# Patient Record
Sex: Female | Born: 1985
Health system: Southern US, Community
[De-identification: ages and names within clinical notes are randomized; demographics above are authoritative.]

## PROBLEM LIST (undated history)

## (undated) DIAGNOSIS — E119 Type 2 diabetes mellitus without complications: Secondary | ICD-10-CM

## (undated) DIAGNOSIS — D649 Anemia, unspecified: Secondary | ICD-10-CM

## (undated) DIAGNOSIS — K429 Umbilical hernia without obstruction or gangrene: Secondary | ICD-10-CM

## (undated) DIAGNOSIS — F419 Anxiety disorder, unspecified: Secondary | ICD-10-CM

## (undated) DIAGNOSIS — I10 Essential (primary) hypertension: Secondary | ICD-10-CM

## (undated) HISTORY — PX: CHOLECYSTECTOMY: SHX55

---

## 2021-02-27 ENCOUNTER — Emergency Department (HOSPITAL_BASED_OUTPATIENT_CLINIC_OR_DEPARTMENT_OTHER)
Admission: EM | Admit: 2021-02-27 | Discharge: 2021-02-27 | Disposition: A | Discharge status: Home / Self Care | Payer: BC Managed Care – PPO | Attending: Emergency Medicine | Admitting: Emergency Medicine

## 2021-02-27 ENCOUNTER — Other Ambulatory Visit (HOSPITAL_COMMUNITY): Payer: Self-pay | Admitting: Emergency Medicine

## 2021-02-27 ENCOUNTER — Other Ambulatory Visit: Payer: Self-pay

## 2021-02-27 ENCOUNTER — Emergency Department (HOSPITAL_BASED_OUTPATIENT_CLINIC_OR_DEPARTMENT_OTHER): Payer: BC Managed Care – PPO

## 2021-02-27 ENCOUNTER — Encounter (HOSPITAL_BASED_OUTPATIENT_CLINIC_OR_DEPARTMENT_OTHER): Payer: Self-pay | Admitting: Emergency Medicine

## 2021-02-27 DIAGNOSIS — F17200 Nicotine dependence, unspecified, uncomplicated: Secondary | ICD-10-CM | POA: Insufficient documentation

## 2021-02-27 DIAGNOSIS — E119 Type 2 diabetes mellitus without complications: Secondary | ICD-10-CM | POA: Diagnosis not present

## 2021-02-27 DIAGNOSIS — S4991XA Unspecified injury of right shoulder and upper arm, initial encounter: Secondary | ICD-10-CM | POA: Diagnosis present

## 2021-02-27 DIAGNOSIS — S40021A Contusion of right upper arm, initial encounter: Secondary | ICD-10-CM | POA: Insufficient documentation

## 2021-02-27 DIAGNOSIS — S40029A Contusion of unspecified upper arm, initial encounter: Secondary | ICD-10-CM

## 2021-02-27 DIAGNOSIS — X501XXA Overexertion from prolonged static or awkward postures, initial encounter: Secondary | ICD-10-CM | POA: Insufficient documentation

## 2021-02-27 HISTORY — DX: Type 2 diabetes mellitus without complications: E11.9

## 2021-02-27 HISTORY — DX: Umbilical hernia without obstruction or gangrene: K42.9

## 2021-02-27 MED ORDER — CYCLOBENZAPRINE HCL 10 MG PO TABS: 10.0000 mg | ORAL_TABLET | Freq: Two times a day (BID) | ORAL | 0 refills | Status: AC | PRN

## 2021-02-27 MED ORDER — HYDROCODONE-ACETAMINOPHEN 5-325 MG PO TABS
1.0000 | ORAL_TABLET | Freq: Once | ORAL | Status: AC
Start: 2021-02-27 — End: 2021-02-27
  Administered 2021-02-27: 1 via ORAL
  Filled 2021-02-27: qty 1

## 2021-02-27 MED FILL — CYCLOBENZAPRINE HCL 10 MG T: 10 | 10 days supply | Qty: 20 | Fill #0

## 2021-02-27 NOTE — Discharge Instructions (Signed)
The x-ray today look normal.  Your arm is most likely bruised badly in the nerve possibly irritated.  You can use Voltaren gel to help with the pain as well as extra strength Tylenol.  Also you can use the muscle relaxer as needed if you are feeling very tight.  I would recommend doing this at night before you go to bed as it can cause drowsiness.
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
## 2021-02-27 NOTE — ED Notes (Signed)
ED Provider at bedside. 
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
## 2021-06-13 IMAGING — CR DG SHOULDER 2+V*R*
3 series · 3 of 3 positions shown · non-contrast
Comparison: None.

CLINICAL DATA: Pain.  Status post trauma

EXAM:
RIGHT SHOULDER - 2+ VIEW

[w shoulder grashey right *]
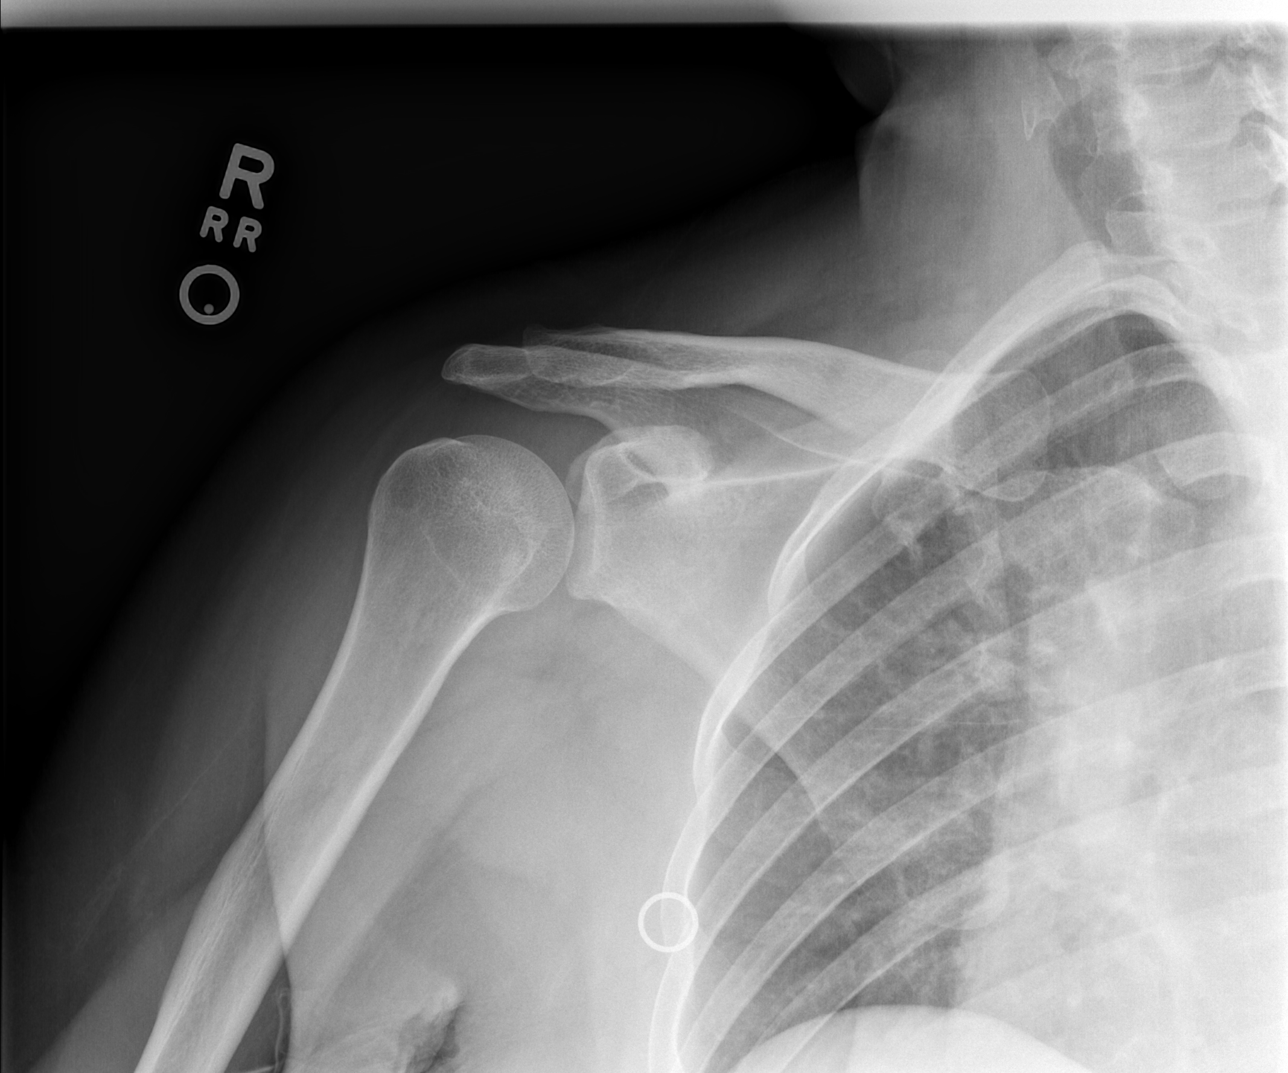

[w shoulder y view right *]
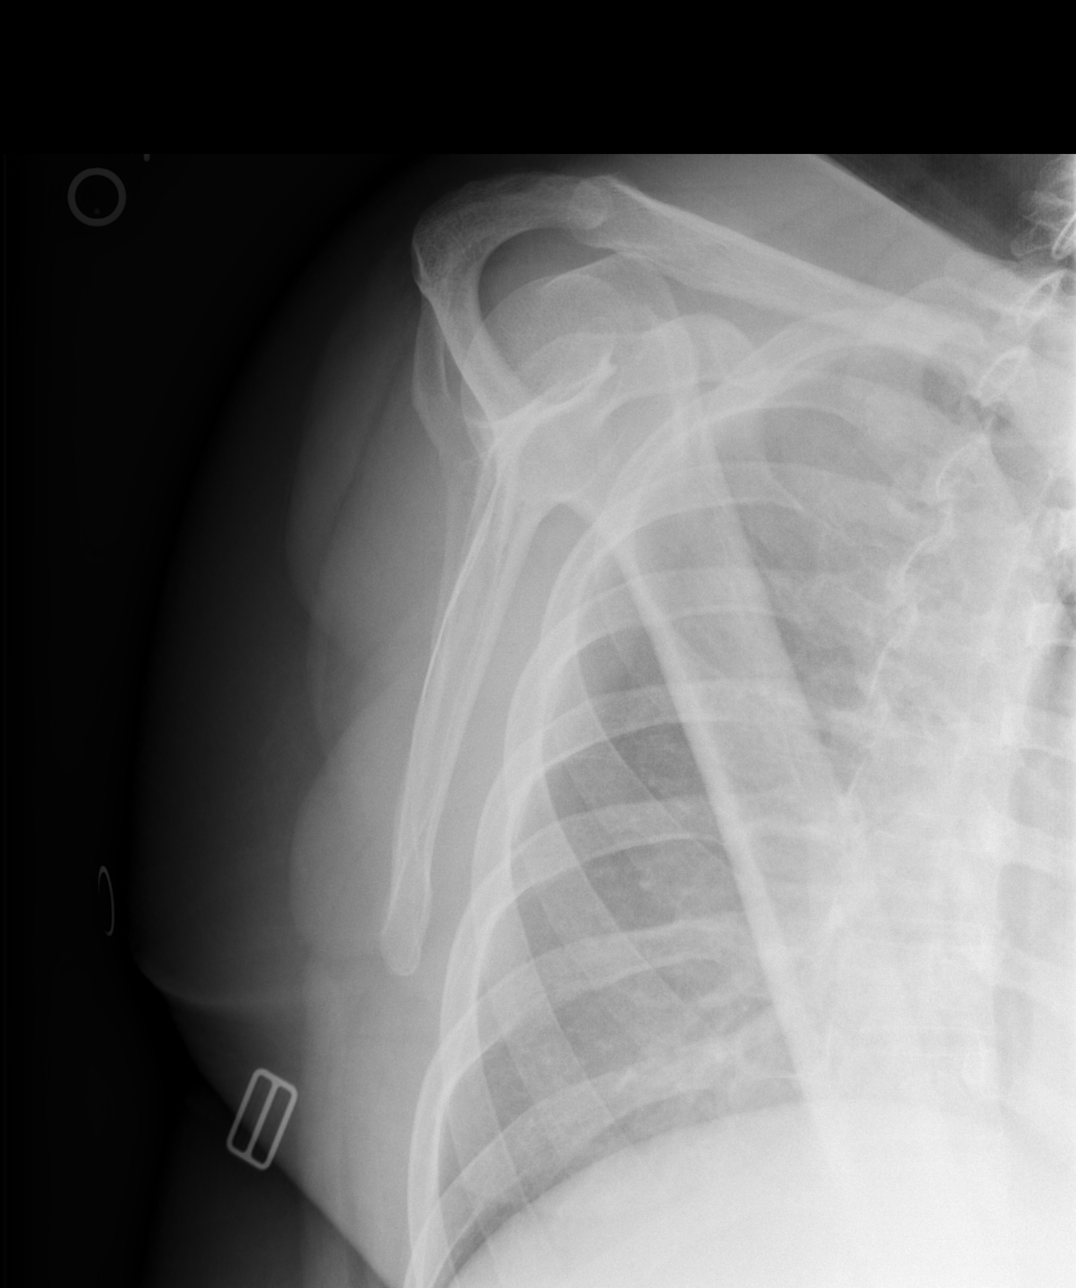

[x shoulder axillary right *]
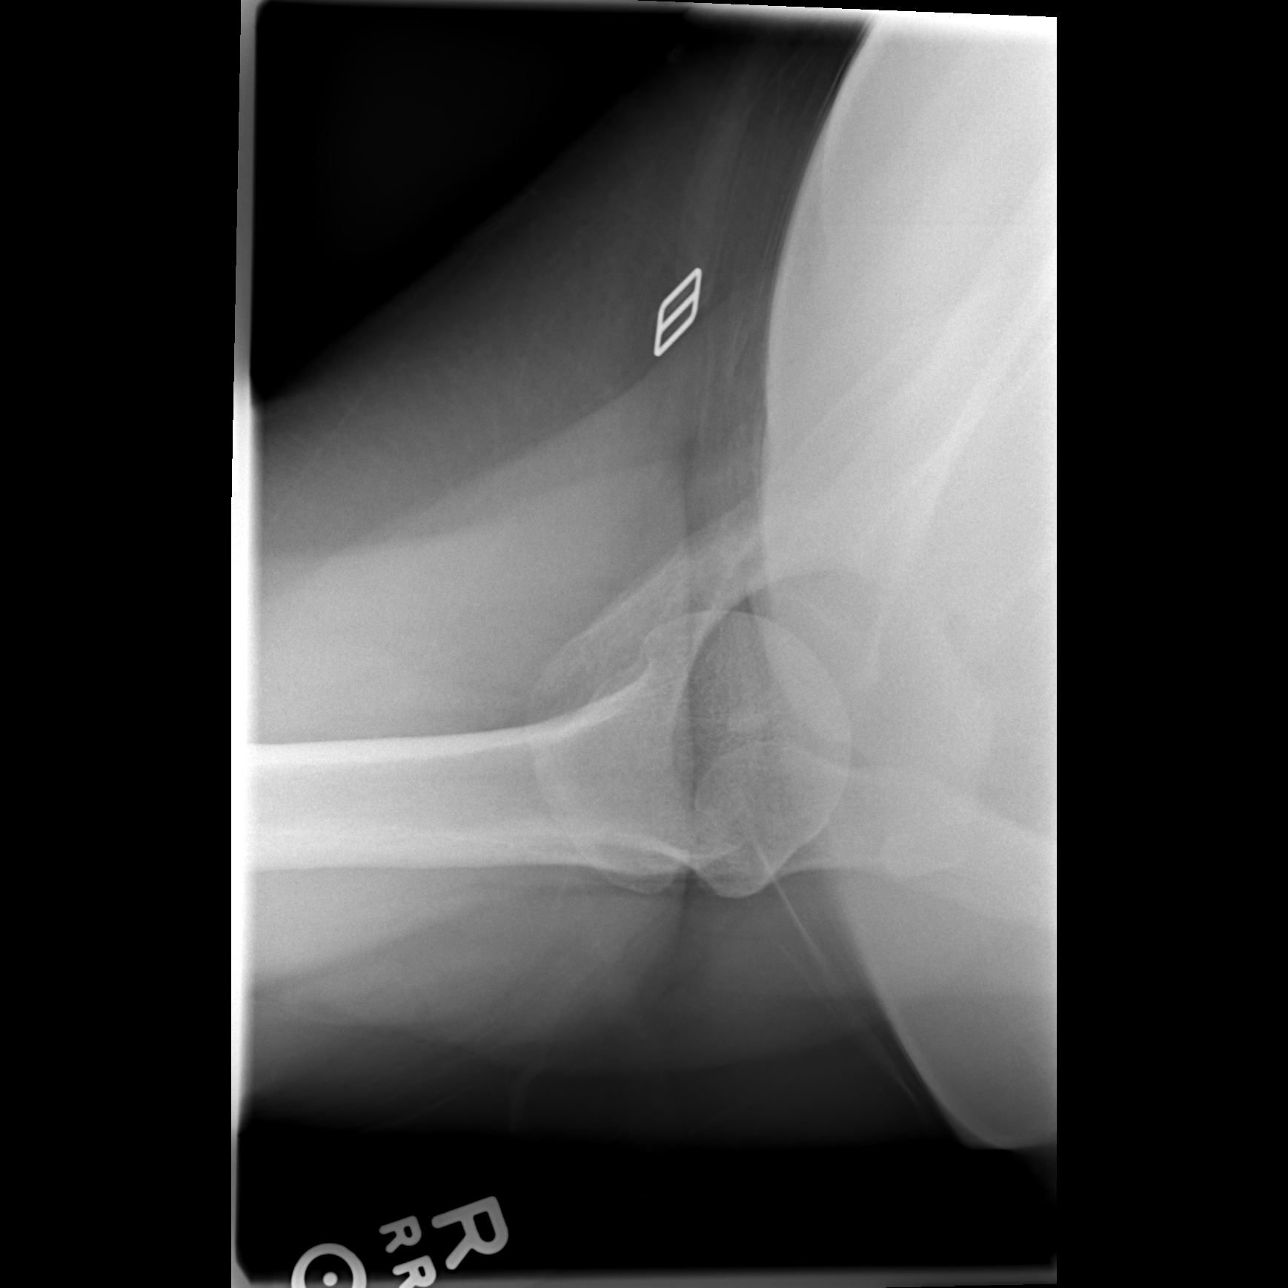

[3 of 3 positions shown; findings below may reference images not displayed]

FINDINGS: There is no evidence of fracture or dislocation. There is no
evidence of arthropathy or other focal bone abnormality. Soft
tissues are unremarkable.
IMPRESSION: Negative.

## 2021-07-11 ENCOUNTER — Emergency Department (HOSPITAL_BASED_OUTPATIENT_CLINIC_OR_DEPARTMENT_OTHER)
Admission: EM | Admit: 2021-07-11 | Discharge: 2021-07-11 | Disposition: A | Discharge status: Home / Self Care | Payer: BC Managed Care – PPO | Attending: Emergency Medicine | Admitting: Emergency Medicine

## 2021-07-11 ENCOUNTER — Encounter (HOSPITAL_BASED_OUTPATIENT_CLINIC_OR_DEPARTMENT_OTHER): Payer: Self-pay | Admitting: *Deleted

## 2021-07-11 ENCOUNTER — Other Ambulatory Visit: Payer: Self-pay

## 2021-07-11 DIAGNOSIS — Z23 Encounter for immunization: Secondary | ICD-10-CM | POA: Insufficient documentation

## 2021-07-11 DIAGNOSIS — Z91040 Latex allergy status: Secondary | ICD-10-CM | POA: Insufficient documentation

## 2021-07-11 DIAGNOSIS — N6310 Unspecified lump in the right breast, unspecified quadrant: Secondary | ICD-10-CM | POA: Diagnosis not present

## 2021-07-11 DIAGNOSIS — E119 Type 2 diabetes mellitus without complications: Secondary | ICD-10-CM | POA: Insufficient documentation

## 2021-07-11 DIAGNOSIS — T23022A Burn of unspecified degree of single left finger (nail) except thumb, initial encounter: Secondary | ICD-10-CM | POA: Diagnosis present

## 2021-07-11 DIAGNOSIS — N63 Unspecified lump in unspecified breast: Secondary | ICD-10-CM

## 2021-07-11 DIAGNOSIS — T23229A Burn of second degree of unspecified single finger (nail) except thumb, initial encounter: Secondary | ICD-10-CM

## 2021-07-11 DIAGNOSIS — Z5321 Procedure and treatment not carried out due to patient leaving prior to being seen by health care provider: Secondary | ICD-10-CM | POA: Insufficient documentation

## 2021-07-11 DIAGNOSIS — F17200 Nicotine dependence, unspecified, uncomplicated: Secondary | ICD-10-CM | POA: Diagnosis not present

## 2021-07-11 DIAGNOSIS — X19XXXA Contact with other heat and hot substances, initial encounter: Secondary | ICD-10-CM | POA: Diagnosis not present

## 2021-07-11 DIAGNOSIS — X101XXA Contact with hot food, initial encounter: Secondary | ICD-10-CM | POA: Insufficient documentation

## 2021-07-11 DIAGNOSIS — T23222A Burn of second degree of single left finger (nail) except thumb, initial encounter: Secondary | ICD-10-CM | POA: Diagnosis not present

## 2021-07-11 MED ORDER — TETANUS-DIPHTH-ACELL PERTUSSIS 5-2.5-18.5 LF-MCG/0.5 IM SUSY
0.5000 mL | PREFILLED_SYRINGE | Freq: Once | INTRAMUSCULAR | Status: AC
  Administered 2021-07-11: 0.5 mL via INTRAMUSCULAR
  Filled 2021-07-11: qty 0.5

## 2021-07-11 MED ORDER — BACITRACIN ZINC 500 UNIT/GM EX OINT
TOPICAL_OINTMENT | Freq: Two times a day (BID) | CUTANEOUS | Status: DC
  Filled 2021-07-11: qty 28.35

## 2021-07-11 NOTE — Discharge Instructions (Addendum)
Apply bacitracin to burn.  Take Motrin and Tylenol as needed as directed.   GYN clinic info: Walk-ins for certain complaints available at:   [facility] [address]  [phone]  See hours at [email or web address]   [facility] [employer] [facility]  [address]  [phone]   [facility] [employer] [facility]  [phone]   You can make an appointment to see a GYN provider:   [facility] at [facility]  [address]  [phone]   [facility] at [facility]  [address] [employer]  [phone]   [facility]  [phone]   [facility] at [facility]  [address]  [phone]   If you already have an established OB/GYN provider in the area you can make an appointment with them as well.
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
## 2021-07-11 NOTE — ED Provider Notes (Signed)
[facility] EMERGENCY DEPARTMENT Provider Note   CSN: [id number] Arrival date & time: 07/11/21  1925     History Chief Complaint  Patient presents with   Burn    Lori Saunders is a 35 y.o. female.  35 year old female presents with complaint of burn to her left third finger.  The patient states that she took a pop tart of the oven and the filling dripped onto her finger resulting in a burn.  Has a blister to the area.  Last tetanus unknown.  Reports also concern for right breast mass, was in the shower today and found a lump in her right upper outer breast area, mother with history of breast cancer.  Unable to find primary care provider since relocating to this area.  No other complaints or concerns.      Past Medical History:  Diagnosis Date   Diabetes mellitus without complication (HCC)    Umbilical hernia     There are no problems to display for this patient.   Past Surgical History:  Procedure Laterality Date   CHOLECYSTECTOMY       OB History   No obstetric history on file.     No family history on file.  Social History   Tobacco Use   Smoking status: Every Day   Smokeless tobacco: Never  Vaping Use   Vaping Use: Some days  Substance Use Topics   Alcohol use: Never   Drug use: Never    Home Medications Prior to Admission medications   Medication Sig Start Date End Date Taking? Authorizing Provider  Canagliflozin (INVOKANA PO) Take by mouth.    [provider]  cyclobenzaprine (FLEXERIL) 10 MG tablet Take 1 tablet (10 mg total) by mouth 2 (two) times daily as needed for muscle spasms. 02/27/21   Gwyneth Sprout, MD  cyclobenzaprine (FLEXERIL) 10 MG tablet TAKE 1 TABLET (10 MG TOTAL) BY MOUTH 2 (TWO) TIMES DAILY AS NEEDED FOR MUSCLE SPASMS. 02/27/21 02/27/22  Gwyneth Sprout, MD    Allergies    Latex  Review of Systems   Review of Systems  Constitutional:  Negative for fever.  Respiratory:  Negative for shortness of breath.    Cardiovascular:  Negative for chest pain.  Gastrointestinal:  Negative for abdominal pain.  Musculoskeletal:  Negative for arthralgias, joint swelling and myalgias.  Skin:  Positive for wound.  Allergic/Immunologic: Positive for immunocompromised state.  Neurological:  Negative for weakness and numbness.   Physical Exam Updated Vital Signs BP 122/85 (BP Location: Right Arm)   Pulse 90   Temp 98.8 F (37.1 C) (Oral)   Resp 19   Ht 5\' 3"  (1.6 m)   Wt 101.2 kg   LMP 07/09/2021   SpO2 98%   BMI 39.52 kg/m   Physical Exam Vitals and nursing note reviewed.  Constitutional:      General: She is not in acute distress.    Appearance: She is well-developed. She is not diaphoretic.  HENT:     Head: Normocephalic and atraumatic.  Cardiovascular:     Pulses: Normal pulses.  Pulmonary:     Effort: Pulmonary effort is normal.  Chest:     Chest wall: No mass, lacerations, deformity, swelling or tenderness.  Musculoskeletal:        General: Tenderness and signs of injury present. No deformity. Normal range of motion.  Skin:    General: Skin is warm and dry.     Findings: No erythema or rash.  Neurological:  Mental Status: She is alert and oriented to person, place, and time.  Psychiatric:        Behavior: Behavior normal.    ED Results / Procedures / Treatments   Labs (all labs ordered are listed, but only abnormal results are displayed) Labs Reviewed - No data to display  EKG None  Radiology No results found.  Procedures Procedures   Medications Ordered in ED Medications  bacitracin ointment (has no administration in time range)  Tdap (BOOSTRIX) injection 0.5 mL (has no administration in time range)    ED Course  I have reviewed the triage vital signs and the nursing notes.  Pertinent labs & imaging results that were available during my care of the patient were reviewed by me and considered in my medical decision making (see chart for details).  Clinical  Course as of 07/11/21 2032  Tue Jul 11, 2021  [id number] 35 year old female with burn to left third finger.  We will update tetanus and apply bacitracin to the wound.  In regards to her right breast mass, nothing appreciated on exam today, will give GYN clinic follow-up information. [LM]    Clinical Course User Index [LM] Alden Hipp   MDM Rules/Calculators/A&P                           Final Clinical Impression(s) / ED Diagnoses Final diagnoses:  Deep partial thickness burn of finger  Breast lump in female    Rx / DC Orders ED Discharge Orders     None        Alden Hipp 07/11/21 2032    Jacalyn Lefevre, MD 07/11/21 2122
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
## 2021-07-13 ENCOUNTER — Encounter (HOSPITAL_BASED_OUTPATIENT_CLINIC_OR_DEPARTMENT_OTHER): Payer: Self-pay | Admitting: *Deleted

## 2021-07-13 ENCOUNTER — Emergency Department (HOSPITAL_BASED_OUTPATIENT_CLINIC_OR_DEPARTMENT_OTHER)
Admission: EM | Admit: 2021-07-13 | Discharge: 2021-07-13 | Disposition: A | Discharge status: Home / Self Care | Payer: BC Managed Care – PPO | Attending: Emergency Medicine | Admitting: Emergency Medicine

## 2021-07-13 ENCOUNTER — Other Ambulatory Visit: Payer: Self-pay

## 2021-07-13 DIAGNOSIS — M533 Sacrococcygeal disorders, not elsewhere classified: Secondary | ICD-10-CM | POA: Diagnosis present

## 2021-07-13 DIAGNOSIS — Z5321 Procedure and treatment not carried out due to patient leaving prior to being seen by health care provider: Secondary | ICD-10-CM | POA: Diagnosis not present

## 2021-07-13 NOTE — ED Triage Notes (Signed)
C/o tail bone pain x 3 days , denies injury , also c/o vaginal discharge and irritation

## 2021-08-23 ENCOUNTER — Encounter: Payer: BC Managed Care – PPO | Admitting: Family Medicine

## 2021-10-08 ENCOUNTER — Other Ambulatory Visit: Payer: Self-pay

## 2021-10-08 ENCOUNTER — Emergency Department (HOSPITAL_BASED_OUTPATIENT_CLINIC_OR_DEPARTMENT_OTHER)
Admission: EM | Admit: 2021-10-08 | Discharge: 2021-10-08 | Disposition: A | Discharge status: Home / Self Care | Payer: BC Managed Care – PPO | Attending: Emergency Medicine | Admitting: Emergency Medicine

## 2021-10-08 ENCOUNTER — Encounter (HOSPITAL_BASED_OUTPATIENT_CLINIC_OR_DEPARTMENT_OTHER): Payer: Self-pay | Admitting: Urology

## 2021-10-08 DIAGNOSIS — E162 Hypoglycemia, unspecified: Secondary | ICD-10-CM | POA: Diagnosis present

## 2021-10-08 DIAGNOSIS — Z5321 Procedure and treatment not carried out due to patient leaving prior to being seen by health care provider: Secondary | ICD-10-CM | POA: Insufficient documentation

## 2021-10-08 LAB — CBG MONITORING, ED: Glucose-Capillary: 169 mg/dL — ABNORMAL HIGH (ref 70–99)

## 2021-10-08 NOTE — ED Triage Notes (Signed)
Has new internal Blood glucose meter, states BS 42, took OJ at 1900, grapes and cheese in past 20 min.  Farxigo started 3 days ago as well.

## 2021-10-29 ENCOUNTER — Emergency Department (HOSPITAL_BASED_OUTPATIENT_CLINIC_OR_DEPARTMENT_OTHER)
Admission: EM | Admit: 2021-10-29 | Discharge: 2021-10-30 | Disposition: A | Discharge status: Home / Self Care | Payer: BC Managed Care – PPO | Attending: Emergency Medicine | Admitting: Emergency Medicine

## 2021-10-29 ENCOUNTER — Other Ambulatory Visit: Payer: Self-pay

## 2021-10-29 ENCOUNTER — Encounter (HOSPITAL_BASED_OUTPATIENT_CLINIC_OR_DEPARTMENT_OTHER): Payer: Self-pay

## 2021-10-29 DIAGNOSIS — R1033 Periumbilical pain: Secondary | ICD-10-CM

## 2021-10-29 DIAGNOSIS — N9489 Other specified conditions associated with female genital organs and menstrual cycle: Secondary | ICD-10-CM | POA: Insufficient documentation

## 2021-10-29 DIAGNOSIS — E119 Type 2 diabetes mellitus without complications: Secondary | ICD-10-CM | POA: Diagnosis not present

## 2021-10-29 DIAGNOSIS — K429 Umbilical hernia without obstruction or gangrene: Secondary | ICD-10-CM | POA: Insufficient documentation

## 2021-10-29 DIAGNOSIS — R197 Diarrhea, unspecified: Secondary | ICD-10-CM | POA: Diagnosis not present

## 2021-10-29 DIAGNOSIS — I10 Essential (primary) hypertension: Secondary | ICD-10-CM | POA: Insufficient documentation

## 2021-10-29 DIAGNOSIS — Z7984 Long term (current) use of oral hypoglycemic drugs: Secondary | ICD-10-CM | POA: Diagnosis not present

## 2021-10-29 DIAGNOSIS — F17210 Nicotine dependence, cigarettes, uncomplicated: Secondary | ICD-10-CM | POA: Diagnosis not present

## 2021-10-29 DIAGNOSIS — R112 Nausea with vomiting, unspecified: Secondary | ICD-10-CM | POA: Diagnosis not present

## 2021-10-29 HISTORY — DX: Essential (primary) hypertension: I10

## 2021-10-29 NOTE — ED Triage Notes (Signed)
Coffee ground emesis and dark stools x3 days.

## 2021-10-30 ENCOUNTER — Other Ambulatory Visit (HOSPITAL_BASED_OUTPATIENT_CLINIC_OR_DEPARTMENT_OTHER): Payer: Self-pay

## 2021-10-30 ENCOUNTER — Emergency Department (HOSPITAL_BASED_OUTPATIENT_CLINIC_OR_DEPARTMENT_OTHER): Payer: BC Managed Care – PPO

## 2021-10-30 LAB — CBC
HCT: 45.4 % (ref 36.0–46.0)
Hemoglobin: 15 g/dL (ref 12.0–15.0)
MCH: 27.1 pg (ref 26.0–34.0)
MCHC: 33 g/dL (ref 30.0–36.0)
MCV: 82.1 fL (ref 80.0–100.0)
Platelets: 430 10*3/uL — ABNORMAL HIGH (ref 150–400)
RBC: 5.53 MIL/uL — ABNORMAL HIGH (ref 3.87–5.11)
RDW: 13.5 % (ref 11.5–15.5)
WBC: 16.8 10*3/uL — ABNORMAL HIGH (ref 4.0–10.5)
nRBC: 0 % (ref 0.0–0.2)

## 2021-10-30 LAB — COMPREHENSIVE METABOLIC PANEL
ALT: 23 U/L (ref 0–44)
AST: 17 U/L (ref 15–41)
Albumin: 4.2 g/dL (ref 3.5–5.0)
Alkaline Phosphatase: 90 U/L (ref 38–126)
Anion gap: 10 (ref 5–15)
BUN: 19 mg/dL (ref 6–20)
CO2: 25 mmol/L (ref 22–32)
Calcium: 9.6 mg/dL (ref 8.9–10.3)
Chloride: 101 mmol/L (ref 98–111)
Creatinine, Ser: 0.66 mg/dL (ref 0.44–1.00)
GFR, Estimated: >60 mL/min (ref >60)
Glucose, Bld: 114 mg/dL — ABNORMAL HIGH (ref 70–99)
Potassium: 3.6 mmol/L (ref 3.5–5.1)
Sodium: 136 mmol/L (ref 135–145)
Total Bilirubin: 0.4 mg/dL (ref 0.3–1.2)
Total Protein: 7.6 g/dL (ref 6.5–8.1)

## 2021-10-30 LAB — LIPASE, BLOOD: Lipase: 35 U/L (ref 11–51)

## 2021-10-30 LAB — HCG, QUANTITATIVE, PREGNANCY: hCG, Beta Chain, Quant, S: <1 m[IU]/mL (ref <5)

## 2021-10-30 MED ORDER — ONDANSETRON 4 MG PO TBDP
4.0000 mg | ORAL_TABLET | Freq: Three times a day (TID) | ORAL | 0 refills | Status: AC | PRN
  Filled 2021-10-30 – 2021-11-10 (×2): qty 15, 5d supply, fill #0

## 2021-10-30 MED ORDER — KETOROLAC TROMETHAMINE 15 MG/ML IJ SOLN
15.0000 mg | Freq: Once | INTRAMUSCULAR | Status: AC
  Administered 2021-10-30: 02:00:00 15 mg via INTRAVENOUS
  Filled 2021-10-30: qty 1

## 2021-10-30 MED ORDER — SODIUM CHLORIDE 0.9 % IV BOLUS
1000.0000 mL | Freq: Once | INTRAVENOUS | Status: AC
  Administered 2021-10-30: 01:00:00 1000 mL via INTRAVENOUS

## 2021-10-30 MED ORDER — IOHEXOL 300 MG/ML  SOLN
100.0000 mL | Freq: Once | INTRAMUSCULAR | Status: AC | PRN
  Administered 2021-10-30: 02:00:00 100 mL via INTRAVENOUS

## 2021-10-30 MED ORDER — MORPHINE SULFATE (PF) 4 MG/ML IV SOLN: 4.0000 mg | Freq: Once | INTRAVENOUS | Status: DC

## 2021-10-30 MED ORDER — ONDANSETRON HCL 4 MG/2ML IJ SOLN
4.0000 mg | Freq: Once | INTRAMUSCULAR | Status: AC
  Administered 2021-10-30: 01:00:00 4 mg via INTRAVENOUS
  Filled 2021-10-30: qty 2

## 2021-10-30 NOTE — ED Notes (Signed)
Patient transported to CT 

## 2021-10-30 NOTE — ED Provider Notes (Signed)
[facility] EMERGENCY DEPARTMENT Provider Note  CSN: [id number] Arrival date & time: 10/29/21 2327  Chief Complaint(s) Abdominal Pain and Chest Pain  HPI Lori Saunders is a 35 y.o. female   The history is provided by the patient.  Abdominal Pain Pain location:  Periumbilical Pain quality: aching and cramping   Pain radiates to:  Epigastric region Pain severity:  Moderate Onset quality:  Gradual Duration:  2 days Timing:  Intermittent Progression:  Waxing and waning Chronicity:  New Relieved by:  Nothing Worsened by:  Vomiting Associated symptoms: chest pain (with emesis), diarrhea, nausea and vomiting   Associated symptoms: no fever   Chest Pain Pain quality: aching   Pain radiates to:  Does not radiate Context comment:  After an episode of emesis Relieved by: self resolved. Associated symptoms: abdominal pain, nausea and vomiting   Associated symptoms: no fever    Past Medical History Past Medical History:  Diagnosis Date   Diabetes mellitus without complication ([place])    Hypertension    Umbilical hernia    There are no problems to display for this patient.  Home Medication(s) Prior to Admission medications   Medication Sig Start Date End Date Taking? Authorizing Provider  ondansetron (ZOFRAN-ODT) 4 MG disintegrating tablet Take 1 tablet (4 mg total) by mouth every 8 (eight) hours as needed for up to 3 days for nausea or vomiting. 10/30/21 11/02/21 Yes Ladell Lea, Grayce Sessions, MD  Canagliflozin (INVOKANA PO) Take by mouth.    [provider]  cyclobenzaprine (FLEXERIL) 10 MG tablet Take 1 tablet (10 mg total) by mouth 2 (two) times daily as needed for muscle spasms. 02/27/21   Blanchie Dessert, MD  cyclobenzaprine (FLEXERIL) 10 MG tablet TAKE 1 TABLET (10 MG TOTAL) BY MOUTH 2 (TWO) TIMES DAILY AS NEEDED FOR MUSCLE SPASMS. 02/27/21 02/27/22  Blanchie Dessert, MD                                                                                                                                     Past Surgical History Past Surgical History:  Procedure Laterality Date   CHOLECYSTECTOMY     Family History No family history on file.  Social History Social History   Tobacco Use   Smoking status: Every Day    Packs/day: 0.50    Types: Cigarettes   Smokeless tobacco: Never  Vaping Use   Vaping Use: Some days  Substance Use Topics   Alcohol use: Never   Drug use: Never   Allergies Latex  Review of Systems Review of Systems  Constitutional:  Negative for fever.  Cardiovascular:  Positive for chest pain (with emesis).  Gastrointestinal:  Positive for abdominal pain, diarrhea, nausea and vomiting.  All other systems are reviewed and are negative for acute change except as noted in the HPI  Physical Exam Vital Signs  I have reviewed the triage vital signs BP 118/81 (BP Location: Right Arm)  Pulse 98   Temp 98.3 F (36.8 C) (Oral)   Resp 18   Ht 5\' 3"  (1.6 m)   Wt 97.1 kg   LMP 10/22/2021 (Exact Date)   SpO2 98%   BMI 37.91 kg/m   Physical Exam Vitals reviewed.  Constitutional:      General: She is not in acute distress.    Appearance: She is well-developed. She is not diaphoretic.  HENT:     Head: Normocephalic and atraumatic.     Right Ear: External ear normal.     Left Ear: External ear normal.     Nose: Nose normal.  Eyes:     General: No scleral icterus.    Conjunctiva/sclera: Conjunctivae normal.  Neck:     Trachea: Phonation normal.  Cardiovascular:     Rate and Rhythm: Normal rate and regular rhythm.  Pulmonary:     Effort: Pulmonary effort is normal. No respiratory distress.     Breath sounds: No stridor.  Abdominal:     General: There is no distension.     Tenderness: There is abdominal tenderness in the periumbilical area.     Hernia: A hernia is present. Hernia is present in the umbilical area.  Musculoskeletal:        General: Normal range of motion.     Cervical back: Normal range of  motion.  Neurological:     Mental Status: She is alert and oriented to person, place, and time.  Psychiatric:        Behavior: Behavior normal.    ED Results and Treatments Labs (all labs ordered are listed, but only abnormal results are displayed) Labs Reviewed  COMPREHENSIVE METABOLIC PANEL - Abnormal; Notable for the following components:      Result Value   Glucose, Bld 114 (*)    All other components within normal limits  CBC - Abnormal; Notable for the following components:   WBC 16.8 (*)    RBC 5.53 (*)    Platelets 430 (*)    All other components within normal limits  LIPASE, BLOOD  HCG, QUANTITATIVE, PREGNANCY  URINALYSIS, ROUTINE W REFLEX MICROSCOPIC  PREGNANCY, URINE                                                                                                                         EKG  EKG Interpretation  Date/Time:  Sunday October 29 2021 23:48:05 EST Ventricular Rate:  101 PR Interval:  128 QRS Duration: 90 QT Interval:  322 QTC Calculation: 417 R Axis:   91 Text Interpretation: Sinus tachycardia Rightward axis T wave abnormality, consider inferior ischemia T wave abnormality, consider anterolateral ischemia Abnormal ECG No old tracing to compare Confirmed by Addison Lank [phone]) on 10/30/2021 1:35:44 AM       Radiology CT ABDOMEN PELVIS W CONTRAST  Result Date: 10/30/2021 CLINICAL DATA:  Cough the ground emesis and dark stools. EXAM: CT ABDOMEN AND PELVIS WITH CONTRAST TECHNIQUE: Multidetector CT imaging of the abdomen and pelvis  was performed using the standard protocol following bolus administration of intravenous contrast. CONTRAST:  OMNIPAQUE IOHEXOL 300 MG/ML  SOLN COMPARISON:  None. FINDINGS: Lower chest: No acute abnormality. Hepatobiliary: No focal liver abnormality is seen. Status post cholecystectomy. No biliary dilatation. Pancreas: Unremarkable. No pancreatic ductal dilatation or surrounding inflammatory changes. Spleen: Normal in  size without focal abnormality. Adrenals/Urinary Tract: Adrenal glands are unremarkable. Kidneys are normal in size, without renal calculi or hydronephrosis. A subcentimeter cyst is seen along the posterolateral aspect of the mid left kidney. Bladder is unremarkable. Stomach/Bowel: Stomach is within normal limits. Appendix appears normal. No evidence of bowel wall thickening, distention, or inflammatory changes. Vascular/Lymphatic: No significant vascular findings are present. No enlarged abdominal or pelvic lymph nodes. Reproductive: Uterus and bilateral adnexa are unremarkable. Other: A 3.2 cm x 3.4 cm fat-containing umbilical hernia is seen. No abdominopelvic ascites. Musculoskeletal: No acute or significant osseous findings. IMPRESSION: 1. Evidence of prior cholecystectomy. 2. Fat-containing umbilical hernia. Electronically Signed   By: Aram Candela M.D.   On: 10/30/2021 02:25    Pertinent labs & imaging results that were available during my care of the patient were reviewed by me and considered in my medical decision making (see MDM for details).  Medications Ordered in ED Medications  sodium chloride 0.9 % bolus 1,000 mL (1,000 mLs Intravenous New Bag/Given 10/30/21 0127)  ondansetron (ZOFRAN) injection 4 mg (4 mg Intravenous Given 10/30/21 0128)  ketorolac (TORADOL) 15 MG/ML injection 15 mg (15 mg Intravenous Given 10/30/21 0130)  iohexol (OMNIPAQUE) 300 MG/ML solution 100 mL (100 mLs Intravenous Contrast Given 10/30/21 0221)                                                                                                                                     Procedures Procedures  (including critical care time)  Medical Decision Making / ED Course I have reviewed the nursing notes for this encounter and the patient's prior records (if available in EHR or on provided paperwork).  Lori Saunders was evaluated in Emergency Department on 10/30/2021 for the symptoms described in the history  of present illness. She was evaluated in the context of the global COVID-19 pandemic, which necessitated consideration that the patient might be at risk for infection with the SARS-CoV-2 virus that causes COVID-19. Institutional protocols and algorithms that pertain to the evaluation of patients at risk for COVID-19 are in a state of rapid change based on information released by regulatory bodies including the CDC and federal and state organizations. These policies and algorithms were followed during the patient's care in the ED.     35 y.o. female presents with vomiting, diarrhea for several days with periumbilical pain. No known possible suspicious food intake. Marland Kitchen decreased oral tolerance. Rest of history as above.  Patient appears well, not in distress, and with no signs of toxicity or dehydration. Abdomen with umbilical hernia, TTP.  Rest of the exam  as above  Labs notable for leukocytosis.  No significant electrolyte derangements or renal insufficiency.  No evidence of bili obstruction or pancreatitis.  CT scan negative for bowel obstruction, severe colitis or other serious intra-abdominal inflammatory/infectious process.  Most consistent with viral gastroenteritis.  Patient treated symptomatically.  Able to tolerate oral intake in the ED.  Discussed symptomatic treatment with the patient and they will follow closely with their PCP.  Pertinent labs & imaging results that were available during my care of the patient were reviewed by me and considered in my medical decision making:    Final Clinical Impression(s) / ED Diagnoses Final diagnoses:  Nausea vomiting and diarrhea  Periumbilical abdominal pain   The patient appears reasonably screened and/or stabilized for discharge and I doubt any other medical condition or other [facility] requiring further screening, evaluation, or treatment in the ED at this time prior to discharge. Safe for discharge with strict return  precautions.  Disposition: Discharge  Condition: Good  I have discussed the results, Dx and Tx plan with the patient/family who expressed understanding and agree(s) with the plan. Discharge instructions discussed at length. The patient/family was given strict return precautions who verbalized understanding of the instructions. No further questions at time of discharge.    ED Discharge Orders          Ordered    ondansetron (ZOFRAN-ODT) 4 MG disintegrating tablet  Every 8 hours PRN        10/30/21 0330             Follow Up: Primary care provider  Call  as needed     This chart was dictated using voice recognition software.  Despite best efforts to proofread,  errors can occur which can change the documentation meaning.    Fatima Blank, MD 10/30/21 0330

## 2021-11-07 ENCOUNTER — Other Ambulatory Visit (HOSPITAL_BASED_OUTPATIENT_CLINIC_OR_DEPARTMENT_OTHER): Payer: Self-pay

## 2021-11-10 ENCOUNTER — Other Ambulatory Visit (HOSPITAL_BASED_OUTPATIENT_CLINIC_OR_DEPARTMENT_OTHER): Payer: Self-pay

## 2021-11-18 ENCOUNTER — Emergency Department (HOSPITAL_BASED_OUTPATIENT_CLINIC_OR_DEPARTMENT_OTHER): Payer: BC Managed Care – PPO

## 2021-11-18 ENCOUNTER — Other Ambulatory Visit: Payer: Self-pay

## 2021-11-18 ENCOUNTER — Encounter (HOSPITAL_BASED_OUTPATIENT_CLINIC_OR_DEPARTMENT_OTHER): Payer: Self-pay | Admitting: Emergency Medicine

## 2021-11-18 ENCOUNTER — Emergency Department (HOSPITAL_BASED_OUTPATIENT_CLINIC_OR_DEPARTMENT_OTHER)
Admission: EM | Admit: 2021-11-18 | Discharge: 2021-11-18 | Disposition: A | Discharge status: Home / Self Care | Payer: BC Managed Care – PPO | Attending: Emergency Medicine | Admitting: Emergency Medicine

## 2021-11-18 DIAGNOSIS — I10 Essential (primary) hypertension: Secondary | ICD-10-CM | POA: Diagnosis not present

## 2021-11-18 DIAGNOSIS — R079 Chest pain, unspecified: Secondary | ICD-10-CM

## 2021-11-18 DIAGNOSIS — Z7984 Long term (current) use of oral hypoglycemic drugs: Secondary | ICD-10-CM | POA: Insufficient documentation

## 2021-11-18 DIAGNOSIS — F17210 Nicotine dependence, cigarettes, uncomplicated: Secondary | ICD-10-CM | POA: Diagnosis not present

## 2021-11-18 DIAGNOSIS — R531 Weakness: Secondary | ICD-10-CM | POA: Insufficient documentation

## 2021-11-18 DIAGNOSIS — E119 Type 2 diabetes mellitus without complications: Secondary | ICD-10-CM | POA: Diagnosis not present

## 2021-11-18 DIAGNOSIS — R197 Diarrhea, unspecified: Secondary | ICD-10-CM

## 2021-11-18 DIAGNOSIS — Z79899 Other long term (current) drug therapy: Secondary | ICD-10-CM | POA: Diagnosis not present

## 2021-11-18 DIAGNOSIS — R1013 Epigastric pain: Secondary | ICD-10-CM | POA: Diagnosis not present

## 2021-11-18 DIAGNOSIS — R072 Precordial pain: Secondary | ICD-10-CM | POA: Diagnosis not present

## 2021-11-18 DIAGNOSIS — Z91040 Latex allergy status: Secondary | ICD-10-CM | POA: Diagnosis not present

## 2021-11-18 DIAGNOSIS — E876 Hypokalemia: Secondary | ICD-10-CM

## 2021-11-18 LAB — C DIFFICILE QUICK SCREEN W PCR REFLEX
C Diff antigen: NEGATIVE
C Diff interpretation: NOT DETECTED
C Diff toxin: NEGATIVE

## 2021-11-18 LAB — CBC
HCT: 42.5 % (ref 36.0–46.0)
Hemoglobin: 14 g/dL (ref 12.0–15.0)
MCH: 27 pg (ref 26.0–34.0)
MCHC: 32.9 g/dL (ref 30.0–36.0)
MCV: 82 fL (ref 80.0–100.0)
Platelets: 385 10*3/uL (ref 150–400)
RBC: 5.18 MIL/uL — ABNORMAL HIGH (ref 3.87–5.11)
RDW: 14.1 % (ref 11.5–15.5)
WBC: 18 10*3/uL — ABNORMAL HIGH (ref 4.0–10.5)
nRBC: 0 % (ref 0.0–0.2)

## 2021-11-18 LAB — COMPREHENSIVE METABOLIC PANEL
ALT: 35 U/L (ref 0–44)
AST: 30 U/L (ref 15–41)
Albumin: 3.6 g/dL (ref 3.5–5.0)
Alkaline Phosphatase: 101 U/L (ref 38–126)
Anion gap: 9 (ref 5–15)
BUN: 10 mg/dL (ref 6–20)
CO2: 22 mmol/L (ref 22–32)
Calcium: 8.5 mg/dL — ABNORMAL LOW (ref 8.9–10.3)
Chloride: 103 mmol/L (ref 98–111)
Creatinine, Ser: 0.45 mg/dL (ref 0.44–1.00)
GFR, Estimated: >60 mL/min (ref >60)
Glucose, Bld: 127 mg/dL — ABNORMAL HIGH (ref 70–99)
Potassium: 3.1 mmol/L — ABNORMAL LOW (ref 3.5–5.1)
Sodium: 134 mmol/L — ABNORMAL LOW (ref 135–145)
Total Bilirubin: 0.8 mg/dL (ref 0.3–1.2)
Total Protein: 7 g/dL (ref 6.5–8.1)

## 2021-11-18 LAB — TROPONIN I (HIGH SENSITIVITY): Troponin I (High Sensitivity): <2 ng/L (ref <18)

## 2021-11-18 LAB — LIPASE, BLOOD: Lipase: 32 U/L (ref 11–51)

## 2021-11-18 MED ORDER — SODIUM CHLORIDE 0.9 % IV BOLUS
1000.0000 mL | Freq: Once | INTRAVENOUS | Status: AC
  Administered 2021-11-18: 1000 mL via INTRAVENOUS

## 2021-11-18 MED ORDER — ONDANSETRON HCL 4 MG PO TABS: 4.0000 mg | ORAL_TABLET | Freq: Three times a day (TID) | ORAL | 0 refills | Status: AC | PRN

## 2021-11-18 MED ORDER — POTASSIUM CHLORIDE CRYS ER 20 MEQ PO TBCR
40.0000 meq | EXTENDED_RELEASE_TABLET | Freq: Once | ORAL | Status: AC
  Administered 2021-11-18: 40 meq via ORAL
  Filled 2021-11-18: qty 2

## 2021-11-18 NOTE — Discharge Instructions (Signed)
You were seen in the emergency department for continued diarrhea and new onset of chest pain and tightness.  You had a chest x-ray EKG and blood work that did not show an obvious explanation for your symptoms.  We are prescribing you some medication for your nausea.  Please try to stay well-hydrated.  Follow-up with your primary care doctor.  Return to the emergency department if any worsening or concerning symptoms.  Your stool studies and C. difficile test are pending at time of discharge and you can follow-up these results in MyChart.  If you are positive you may require further treatment.

## 2021-11-18 NOTE — ED Triage Notes (Signed)
Pt reports n/v for the past several weeks. Yesterday began to have central chest pain radiating to R shoulder and weakness.

## 2021-11-18 NOTE — ED Notes (Signed)
Patient identified as Lori Saunders by Tamsen Snider, RRT, RCP.

## 2021-11-18 NOTE — ED Notes (Signed)
ED Provider at bedside. 

## 2021-11-18 NOTE — ED Provider Notes (Signed)
[facility] EMERGENCY DEPARTMENT Provider Note   CSN: [id number] Arrival date & time: 11/18/21  [id number]     History Chief Complaint  Patient presents with   Chest Pain    Lori Saunders is a 35 y.o. female.  She has a history of diabetes hypertension.  Complaining of some chest tightness that started yesterday.  Radiates to her shoulder blades.  Denies pain.  Feels fatigued.  Has been troubled by 2 months of nausea vomiting diarrhea.  Has been on various nausea medications without any improvement.  No blood from above or below.  No recent fevers.  Thought to be food poisoning at 1 point and the GI bug had another.  Symptoms continue.  Losing weight.  The history is provided by the patient.  Chest Pain Pain location:  Substernal area and epigastric Pain quality: tightness   Pain radiates to:  R shoulder and L shoulder Onset quality:  Gradual Duration:  2 days Progression:  Unchanged Chronicity:  New Relieved by:  None tried Worsened by:  Nothing Ineffective treatments:  None tried Associated symptoms: dizziness, nausea and vomiting   Associated symptoms: no abdominal pain, no cough, no fever and no shortness of breath       Past Medical History:  Diagnosis Date   Diabetes mellitus without complication ([place])    Hypertension    Umbilical hernia     There are no problems to display for this patient.   Past Surgical History:  Procedure Laterality Date   CHOLECYSTECTOMY       OB History   No obstetric history on file.     History reviewed. No pertinent family history.  Social History   Tobacco Use   Smoking status: Every Day    Packs/day: 0.50    Types: Cigarettes   Smokeless tobacco: Never  Vaping Use   Vaping Use: Some days  Substance Use Topics   Alcohol use: Never   Drug use: Never    Home Medications Prior to Admission medications   Medication Sig Start Date End Date Taking? Authorizing Provider  atorvastatin (LIPITOR) 10 MG tablet Take  10 mg by mouth daily.   Yes [provider]  Dapagliflozin Propanediol (FARXIGA PO) Take by mouth.   Yes [provider]  lisinopril (ZESTRIL) 10 MG tablet Take 10 mg by mouth daily.   Yes [provider]  Canagliflozin (INVOKANA PO) Take by mouth.    [provider]  cyclobenzaprine (FLEXERIL) 10 MG tablet Take 1 tablet (10 mg total) by mouth 2 (two) times daily as needed for muscle spasms. 02/27/21   Blanchie Dessert, MD  cyclobenzaprine (FLEXERIL) 10 MG tablet TAKE 1 TABLET (10 MG TOTAL) BY MOUTH 2 (TWO) TIMES DAILY AS NEEDED FOR MUSCLE SPASMS. 02/27/21 02/27/22  Blanchie Dessert, MD    Allergies    Latex  Review of Systems   Review of Systems  Constitutional:  Negative for fever.  HENT:  Negative for sore throat.   Eyes:  Negative for visual disturbance.  Respiratory:  Negative for cough and shortness of breath.   Cardiovascular:  Positive for chest pain.  Gastrointestinal:  Positive for nausea and vomiting. Negative for abdominal pain.  Genitourinary:  Negative for dysuria.  Musculoskeletal:  Negative for neck pain.  Skin:  Negative for rash.  Neurological:  Positive for dizziness.   Physical Exam Updated Vital Signs BP 117/83    Pulse 93    Temp 98.1 F (36.7 C) (Oral)    Resp 16  LMP 10/22/2021 (Exact Date) Comment: Neg HCG   SpO2 99%   Physical Exam Vitals and nursing note reviewed.  Constitutional:      General: She is not in acute distress.    Appearance: She is well-developed.  HENT:     Head: Normocephalic and atraumatic.  Eyes:     Conjunctiva/sclera: Conjunctivae normal.  Cardiovascular:     Rate and Rhythm: Normal rate and regular rhythm.     Heart sounds: Normal heart sounds. No murmur heard. Pulmonary:     Effort: Pulmonary effort is normal. No respiratory distress.     Breath sounds: Normal breath sounds.  Abdominal:     Palpations: Abdomen is soft. There is no mass.     Tenderness: There is no abdominal tenderness.   Musculoskeletal:        General: No swelling. Normal range of motion.     Cervical back: Neck supple.     Right lower leg: No tenderness.     Left lower leg: No tenderness.  Skin:    General: Skin is warm and dry.     Capillary Refill: Capillary refill takes less than 2 seconds.  Neurological:     Mental Status: She is alert.  Psychiatric:        Mood and Affect: Mood normal.    ED Results / Procedures / Treatments   Labs (all labs ordered are listed, but only abnormal results are displayed) Labs Reviewed  CBC - Abnormal; Notable for the following components:      Result Value   WBC 18.0 (*)    RBC 5.18 (*)    All other components within normal limits  COMPREHENSIVE METABOLIC PANEL - Abnormal; Notable for the following components:   Sodium 134 (*)    Potassium 3.1 (*)    Glucose, Bld 127 (*)    Calcium 8.5 (*)    All other components within normal limits  C DIFFICILE QUICK SCREEN W PCR REFLEX    GASTROINTESTINAL PANEL BY PCR, STOOL (REPLACES STOOL CULTURE)  LIPASE, BLOOD  TROPONIN I (HIGH SENSITIVITY)    EKG EKG Interpretation  Date/Time:  Saturday November 18 2021 08:20:22 EST Ventricular Rate:  87 PR Interval:  137 QRS Duration: 97 QT Interval:  345 QTC Calculation: 415 R Axis:   110 Text Interpretation: Sinus rhythm Borderline right axis deviation Low voltage, precordial leads Borderline T abnormalities, anterior leads No significant change since prior 11/22 Confirmed by Aletta Edouard [phone]) on 11/18/2021 8:29:35 AM  Radiology DG Chest 2 View  Result Date: 11/18/2021 CLINICAL DATA:  Chest pain EXAM: CHEST - 2 VIEW COMPARISON:  None. FINDINGS: Heart size and mediastinal contours are within normal limits. No suspicious pulmonary opacities identified. No pleural effusion or pneumothorax visualized. No acute osseous abnormality appreciated. IMPRESSION: No acute intrathoracic process identified. Electronically Signed   By: Ofilia Neas M.D.   On: 11/18/2021  09:05    Procedures Procedures   Medications Ordered in ED Medications  sodium chloride 0.9 % bolus 1,000 mL (0 mLs Intravenous Stopped 11/18/21 1014)  sodium chloride 0.9 % bolus 1,000 mL ( Intravenous Stopped 11/18/21 1128)  potassium chloride SA (KLOR-CON M) CR tablet 40 mEq (40 mEq Oral Given 11/18/21 1028)    ED Course  I have reviewed the triage vital signs and the nursing notes.  Pertinent labs & imaging results that were available during my care of the patient were reviewed by me and considered in my medical decision making (see chart for details).  Clinical  Course as of 11/18/21 1732  Sat Nov 18, 2021  0913 Chest x-ray interpreted by me as no acute infiltrates.  Awaiting radiology reading. [MB]    Clinical Course User Index [MB] Terrilee Files, MD   MDM Rules/Calculators/A&P                         This patient complains of chest tightness fatigue diarrhea general weakness; this involves an extensive number of treatment Options and is a complaint that carries with it a high risk of complications and Morbidity. The differential includes gastroenteritis, colitis, dehydration, metabolic derangement, ACS, pneumonia, PE, pneumothorax  I ordered, reviewed and interpreted labs, which included CBC with elevated white count normal hemoglobin, chemistries with low potassium LFTs, troponins flat, C. difficile negative, stool studies pending at time of discharge I ordered medication IV fluids oral potassium I ordered imaging studies which included chest x-ray and I independently    visualized and interpreted imaging which showed no acute findings Additional history obtained from epic significant other Previous records obtained and reviewed in epic no recent admissions  After the interventions stated above, I reevaluated the patient and found patient to be symptomatically improved.  Her vitals are unremarkable.  She is ambulatory in department without any difficulty no  lightheadedness.  Recommended continued hydration and follow-up with her primary care doctor.  Return instructions discussed.  She understands that she still has some pending labs that may need intervention.     Final Clinical Impression(s) / ED Diagnoses Final diagnoses:  Nonspecific chest pain  Diarrhea, unspecified type  Hypokalemia    Rx / DC Orders ED Discharge Orders          Ordered    ondansetron (ZOFRAN) 4 MG tablet  Every 8 hours PRN        11/18/21 1125             Terrilee Files, MD 11/18/21 1734

## 2021-11-19 LAB — GASTROINTESTINAL PANEL BY PCR, STOOL (REPLACES STOOL CULTURE)

## 2021-12-28 ENCOUNTER — Encounter (HOSPITAL_COMMUNITY): Payer: Self-pay

## 2021-12-28 ENCOUNTER — Emergency Department (HOSPITAL_BASED_OUTPATIENT_CLINIC_OR_DEPARTMENT_OTHER)
Admission: EM | Admit: 2021-12-28 | Discharge: 2021-12-28 | Disposition: A | Discharge status: Home / Self Care | Payer: BC Managed Care – PPO | Source: Home / Self Care | Attending: Emergency Medicine | Admitting: Emergency Medicine

## 2021-12-28 ENCOUNTER — Other Ambulatory Visit: Payer: Self-pay

## 2021-12-28 ENCOUNTER — Emergency Department (HOSPITAL_COMMUNITY)
Admission: EM | Admit: 2021-12-28 | Discharge: 2021-12-28 | Disposition: A | Discharge status: Home / Self Care | Payer: BC Managed Care – PPO | Attending: Emergency Medicine | Admitting: Emergency Medicine

## 2021-12-28 ENCOUNTER — Encounter (HOSPITAL_BASED_OUTPATIENT_CLINIC_OR_DEPARTMENT_OTHER): Payer: Self-pay

## 2021-12-28 DIAGNOSIS — I10 Essential (primary) hypertension: Secondary | ICD-10-CM | POA: Insufficient documentation

## 2021-12-28 DIAGNOSIS — R197 Diarrhea, unspecified: Secondary | ICD-10-CM | POA: Diagnosis not present

## 2021-12-28 DIAGNOSIS — R1013 Epigastric pain: Secondary | ICD-10-CM | POA: Insufficient documentation

## 2021-12-28 DIAGNOSIS — E119 Type 2 diabetes mellitus without complications: Secondary | ICD-10-CM | POA: Insufficient documentation

## 2021-12-28 DIAGNOSIS — R112 Nausea with vomiting, unspecified: Secondary | ICD-10-CM | POA: Insufficient documentation

## 2021-12-28 DIAGNOSIS — R55 Syncope and collapse: Secondary | ICD-10-CM | POA: Insufficient documentation

## 2021-12-28 DIAGNOSIS — R42 Dizziness and giddiness: Secondary | ICD-10-CM | POA: Diagnosis not present

## 2021-12-28 DIAGNOSIS — Z5321 Procedure and treatment not carried out due to patient leaving prior to being seen by health care provider: Secondary | ICD-10-CM | POA: Insufficient documentation

## 2021-12-28 LAB — CBC WITH DIFFERENTIAL/PLATELET
Abs Immature Granulocytes: 0.06 10*3/uL (ref 0.00–0.07)
Abs Immature Granulocytes: 0.05 10*3/uL (ref 0.00–0.07)
Basophils Absolute: 0.1 10*3/uL (ref 0.0–0.1)
Basophils Absolute: 0.1 10*3/uL (ref 0.0–0.1)
Basophils Relative: 1 %
Basophils Relative: 0 %
Eosinophils Absolute: 0.5 10*3/uL (ref 0.0–0.5)
Eosinophils Absolute: 0.2 10*3/uL (ref 0.0–0.5)
Eosinophils Relative: 3 %
Eosinophils Relative: 1 %
HCT: 46.1 % — ABNORMAL HIGH (ref 36.0–46.0)
HCT: 43.2 % (ref 36.0–46.0)
Hemoglobin: 14.4 g/dL (ref 12.0–15.0)
Hemoglobin: 14.2 g/dL (ref 12.0–15.0)
Immature Granulocytes: 0 %
Immature Granulocytes: 0 %
Lymphocytes Relative: 10 %
Lymphocytes Relative: 10 %
Lymphs Abs: 1.7 10*3/uL (ref 0.7–4.0)
Lymphs Abs: 1.4 10*3/uL (ref 0.7–4.0)
MCH: 26.5 pg (ref 26.0–34.0)
MCH: 26.9 pg (ref 26.0–34.0)
MCHC: 31.2 g/dL (ref 30.0–36.0)
MCHC: 32.9 g/dL (ref 30.0–36.0)
MCV: 84.9 fL (ref 80.0–100.0)
MCV: 82 fL (ref 80.0–100.0)
Monocytes Absolute: 0.7 10*3/uL (ref 0.1–1.0)
Monocytes Absolute: 0.5 10*3/uL (ref 0.1–1.0)
Monocytes Relative: 4 %
Monocytes Relative: 3 %
Neutro Abs: 12.9 10*3/uL — ABNORMAL HIGH (ref 1.7–7.7)
Neutro Abs: 11.7 10*3/uL — ABNORMAL HIGH (ref 1.7–7.7)
Neutrophils Relative %: 82 %
Neutrophils Relative %: 86 %
Platelets: 399 10*3/uL (ref 150–400)
Platelets: 401 10*3/uL — ABNORMAL HIGH (ref 150–400)
RBC: 5.43 MIL/uL — ABNORMAL HIGH (ref 3.87–5.11)
RBC: 5.27 MIL/uL — ABNORMAL HIGH (ref 3.87–5.11)
RDW: 14.3 % (ref 11.5–15.5)
RDW: 14.2 % (ref 11.5–15.5)
WBC: 15.8 10*3/uL — ABNORMAL HIGH (ref 4.0–10.5)
WBC: 13.8 10*3/uL — ABNORMAL HIGH (ref 4.0–10.5)
nRBC: 0 % (ref 0.0–0.2)
nRBC: 0 % (ref 0.0–0.2)

## 2021-12-28 LAB — COMPREHENSIVE METABOLIC PANEL
ALT: 19 U/L (ref 0–44)
ALT: 17 U/L (ref 0–44)
AST: 16 U/L (ref 15–41)
AST: 15 U/L (ref 15–41)
Albumin: 3.9 g/dL (ref 3.5–5.0)
Albumin: 3.9 g/dL (ref 3.5–5.0)
Alkaline Phosphatase: 86 U/L (ref 38–126)
Alkaline Phosphatase: 87 U/L (ref 38–126)
Anion gap: 8 (ref 5–15)
Anion gap: 12 (ref 5–15)
BUN: 12 mg/dL (ref 6–20)
BUN: 14 mg/dL (ref 6–20)
CO2: 22 mmol/L (ref 22–32)
CO2: 21 mmol/L — ABNORMAL LOW (ref 22–32)
Calcium: 9.2 mg/dL (ref 8.9–10.3)
Calcium: 8.8 mg/dL — ABNORMAL LOW (ref 8.9–10.3)
Chloride: 104 mmol/L (ref 98–111)
Chloride: 103 mmol/L (ref 98–111)
Creatinine, Ser: 0.57 mg/dL (ref 0.44–1.00)
Creatinine, Ser: 0.5 mg/dL (ref 0.44–1.00)
GFR, Estimated: >60 mL/min (ref >60)
GFR, Estimated: >60 mL/min (ref >60)
Glucose, Bld: 132 mg/dL — ABNORMAL HIGH (ref 70–99)
Glucose, Bld: 118 mg/dL — ABNORMAL HIGH (ref 70–99)
Potassium: 3.9 mmol/L (ref 3.5–5.1)
Potassium: 3.4 mmol/L — ABNORMAL LOW (ref 3.5–5.1)
Sodium: 134 mmol/L — ABNORMAL LOW (ref 135–145)
Sodium: 136 mmol/L (ref 135–145)
Total Bilirubin: 0.6 mg/dL (ref 0.3–1.2)
Total Bilirubin: 0.9 mg/dL (ref 0.3–1.2)
Total Protein: 7.3 g/dL (ref 6.5–8.1)
Total Protein: 7.8 g/dL (ref 6.5–8.1)

## 2021-12-28 LAB — I-STAT BETA HCG BLOOD, ED (MC, WL, AP ONLY): I-stat hCG, quantitative: <5 m[IU]/mL (ref <5)

## 2021-12-28 LAB — LIPASE, BLOOD: Lipase: 28 U/L (ref 11–51)

## 2021-12-28 LAB — TROPONIN I (HIGH SENSITIVITY)
Troponin I (High Sensitivity): 2 ng/L (ref <18)
Troponin I (High Sensitivity): <2 ng/L (ref <18)

## 2021-12-28 MED ORDER — SODIUM CHLORIDE 0.9 % IV BOLUS
1000.0000 mL | Freq: Once | INTRAVENOUS | Status: AC
  Administered 2021-12-28: 1000 mL via INTRAVENOUS

## 2021-12-28 MED ORDER — PROMETHAZINE HCL 25 MG/ML IJ SOLN
INTRAMUSCULAR | Status: AC
  Filled 2021-12-28: qty 1

## 2021-12-28 MED ORDER — BUTALBITAL-APAP-CAFFEINE 50-325-40 MG PO TABS
1.0000 | ORAL_TABLET | Freq: Once | ORAL | Status: DC
  Filled 2021-12-28: qty 1

## 2021-12-28 MED ORDER — SODIUM CHLORIDE 0.9 % IV SOLN
25.0000 mg | Freq: Four times a day (QID) | INTRAVENOUS | Status: DC | PRN
  Administered 2021-12-28: 25 mg via INTRAVENOUS
  Filled 2021-12-28: qty 1

## 2021-12-28 MED ORDER — ONDANSETRON HCL 4 MG/2ML IJ SOLN
4.0000 mg | Freq: Once | INTRAMUSCULAR | Status: AC
  Administered 2021-12-28: 4 mg via INTRAVENOUS
  Filled 2021-12-28: qty 2

## 2021-12-28 MED ORDER — FAMOTIDINE IN NACL 20-0.9 MG/50ML-% IV SOLN
20.0000 mg | Freq: Once | INTRAVENOUS | Status: AC
  Administered 2021-12-28: 20 mg via INTRAVENOUS
  Filled 2021-12-28: qty 50

## 2021-12-28 NOTE — ED Notes (Signed)
Delay in EKG. Pt had urge to run to restroom.

## 2021-12-28 NOTE — ED Triage Notes (Signed)
PT c/o nausea, vomiting, and diarrhea that started around 0830. Pt states she becomes dizzy and light headed when she vomits. Pt states she was seen at [facility] pta.

## 2021-12-28 NOTE — ED Notes (Signed)
Pt called back for redraw of labs due to hemolysis. Pt c/o of treatment in the waiting room. Pt states she was sent here by her PCP because she had a near syncopal episode while vomiting at the office earlier today. Pt reports feeling flushed and dizzy, w/ tingling in her lips and staff at PCP office had to hold her up. Pt states this began happening again in the WR. NAD noted in triage, vitals rechecked.

## 2021-12-28 NOTE — ED Triage Notes (Signed)
Pt arrived POV from home c/o N/V/D that started this morning. Pt states her doctor told her to come. Pt states this happened a while back when she started a new medication for diabetes. Pt was switched and has been on her new meds for over 5 weeks now.

## 2021-12-28 NOTE — ED Provider Notes (Signed)
[facility] EMERGENCY DEPARTMENT Provider Note   CSN: [id number] Arrival date & time: 12/28/21  1431     History  Chief Complaint  Patient presents with   Nausea   Emesis   Dizziness    Lori Saunders is a 36 y.o. female hx of diabetes, umbilical hernia, HTN here with nausea, vomiting, syncope.  Patient states that she was switched diabetic medicine recently.  Patient states that she began vomiting this morning.  She also has some diarrhea.  She states that when she vomited, she passed out.  She went to doctor's office and was given Phenergan IM.  Patient was told to come to the ER for hydration and further evaluation.  Patient denies any chest pain or shortness of breath.  Patient apparently went to [facility] and has a white blood cell count of 15 Her CMP was unremarkable and her first troponin was negative. She left without being seen there and came here.  Denies any further episodes of syncope.  States that she is still nauseated has some headaches as well.   The history is provided by the patient.      Home Medications Prior to Admission medications   Medication Sig Start Date End Date Taking? Authorizing Provider  atorvastatin (LIPITOR) 10 MG tablet Take 10 mg by mouth daily.    [provider]  Canagliflozin (INVOKANA PO) Take by mouth.    [provider]  cyclobenzaprine (FLEXERIL) 10 MG tablet Take 1 tablet (10 mg total) by mouth 2 (two) times daily as needed for muscle spasms. 02/27/21   Gwyneth Sprout, MD  cyclobenzaprine (FLEXERIL) 10 MG tablet TAKE 1 TABLET (10 MG TOTAL) BY MOUTH 2 (TWO) TIMES DAILY AS NEEDED FOR MUSCLE SPASMS. 02/27/21 02/27/22  Gwyneth Sprout, MD  Dapagliflozin Propanediol (FARXIGA PO) Take by mouth.    [provider]  lisinopril (ZESTRIL) 10 MG tablet Take 10 mg by mouth daily.    [provider]  ondansetron (ZOFRAN) 4 MG tablet Take 1 tablet (4 mg total) by mouth every 8 (eight) hours as needed  for nausea or vomiting. 11/18/21   Terrilee Files, MD      Allergies    Latex    Review of Systems   Review of Systems  Gastrointestinal:  Positive for vomiting.  Neurological:  Positive for dizziness.  All other systems reviewed and are negative.  Physical Exam Updated Vital Signs BP 124/77    Pulse 95    Temp 98.7 F (37.1 C)    Resp 17    Ht 5\' 3"  (1.6 m)    Wt 93.9 kg    LMP 12/07/2021    SpO2 99%    BMI 36.67 kg/m  Physical Exam Vitals and nursing note reviewed.  Constitutional:      Comments: Slightly dehydrated  HENT:     Head: Normocephalic.     Nose: Nose normal.     Mouth/Throat:     Mouth: Mucous membranes are dry.  Eyes:     Extraocular Movements: Extraocular movements intact.     Pupils: Pupils are equal, round, and reactive to light.  Cardiovascular:     Rate and Rhythm: Normal rate and regular rhythm.     Pulses: Normal pulses.     Heart sounds: Normal heart sounds.  Pulmonary:     Effort: Pulmonary effort is normal.     Breath sounds: Normal breath sounds.  Abdominal:     General: Abdomen is flat.  Palpations: Abdomen is soft.     Comments: Mild epigastric tenderness  Musculoskeletal:        General: Normal range of motion.     Cervical back: Normal range of motion and neck supple.  Skin:    General: Skin is warm.     Capillary Refill: Capillary refill takes less than 2 seconds.  Neurological:     General: No focal deficit present.     Mental Status: She is oriented to person, place, and time.  Psychiatric:        Mood and Affect: Mood normal.        Behavior: Behavior normal.    ED Results / Procedures / Treatments   Labs (all labs ordered are listed, but only abnormal results are displayed) Labs Reviewed  CBC WITH DIFFERENTIAL/PLATELET  COMPREHENSIVE METABOLIC PANEL  TROPONIN I (HIGH SENSITIVITY)    EKG None  Radiology No results found.  Procedures Procedures    Medications Ordered in ED Medications  sodium chloride  0.9 % bolus 1,000 mL (has no administration in time range)  ondansetron (ZOFRAN) injection 4 mg (has no administration in time range)  butalbital-acetaminophen-caffeine (FIORICET) 50-325-40 MG per tablet 1 tablet (has no administration in time range)    ED Course/ Medical Decision Making/ A&P                           Medical Decision Making Lori Saunders is a 36 y.o. female here presenting with vomiting and near syncope.  I think likely vasovagal syncope.  I reviewed records from [facility] earlier today.  She had a white blood cell count of 15.  She had unremarkable CMP and normal troponin at that time.  I have low suspicion for PE or dissection or intra-abdominal process.  I think she may have some gastritis or diabetic gastroparesis.  At this point, will repeat CBC and CMP and get a second troponin.  Will hydrate patient with IV fluids and give Zofran for nausea. Will reassess   4:44 PM Patient's second troponin is negative.  White blood cell count is down to 13.  Patient felt better after IV fluids and Zofran.  She requests another dose of Phenergan.  She has Phenergan at home.  She was able to tolerate p.o. in the ED.  Stable for discharge  Problems Addressed: Nausea and vomiting, unspecified vomiting type: acute illness or injury Near syncope: acute illness or injury  Amount and/or Complexity of Data Reviewed Independent Historian: spouse External Data Reviewed: notes.    Details: reviewed notes from [facility] earlier in the day Labs: ordered. Decision-making details documented in ED Course. ECG/medicine tests: independent interpretation performed. Decision-making details documented in ED Course.    Details: I reviewed EKG from earlier today and showed sinus rhythm with no ischemic changes  Risk Prescription drug management.  Final Clinical Impression(s) / ED Diagnoses Final diagnoses:  None    Rx / DC Orders ED Discharge Orders     None         Charlynne Pander,  MD 12/28/21 1646

## 2021-12-28 NOTE — Discharge Instructions (Signed)
Your work-up in the ED were unremarkable.  Your blood blood cell count continues to come down and your heart enzymes were normal.  You likely have a stomach virus.    Please stay hydrated.  Take Phenergan as prescribed by your doctor  See your doctor for follow-up  Return to ER if you have worse vomiting, abdominal pain, passing out

## 2021-12-28 NOTE — ED Provider Triage Note (Signed)
Emergency Medicine Provider Triage Evaluation Note  Lori Saunders , a 36 y.o. female  was evaluated in triage.  Pt has a past medical history of type 2 diabetes.  She was seen at her primary care provider this morning due to nausea, vomiting and dizziness and sweating during her vomiting episodes.  She had epigastric tenderness and they told her to come to the emergency department  She has been on Ozempic for 5 weeks and reports it is not been bothering her until the next few days  Review of Systems  Positive: Abdominal pain, nausea, vomiting, chest tightness during vomiting, dizziness Negative: Melena, hematochezia, fevers   Physical Exam  BP 115/84 (BP Location: Right Arm)    Pulse 94    Temp 98.7 F (37.1 C) (Oral)    Resp 16    SpO2 99%  Gen:   Awake, no distress   Resp:  Normal effort  MSK:   Moves extremities without difficulty  Other:  Periumbilical hernia, no signs of incarceration or strangulation.  Reducible and she reports that this has been here for a long time  Medical Decision Making  Medically screening exam initiated at 11:12 AM.  Appropriate orders placed.  Lori Saunders was informed that the remainder of the evaluation will be completed by another provider, this initial triage assessment does not replace that evaluation, and the importance of remaining in the ED until their evaluation is complete.     Lori Saunders, [place] 12/28/21 1115

## 2021-12-28 NOTE — ED Notes (Signed)
This tech went to bring pt back. Pt asked if she would be in a room and when she was informed she was being put in a hallway bed pt sts " fuck this im leaving. Pt sts she is going to another hospital .

## 2022-03-04 IMAGING — DX DG CHEST 2V
2 series · 2 of 2 positions shown · non-contrast
Comparison: None.

CLINICAL DATA: Chest pain

EXAM:
CHEST - 2 VIEW

[chest pa]
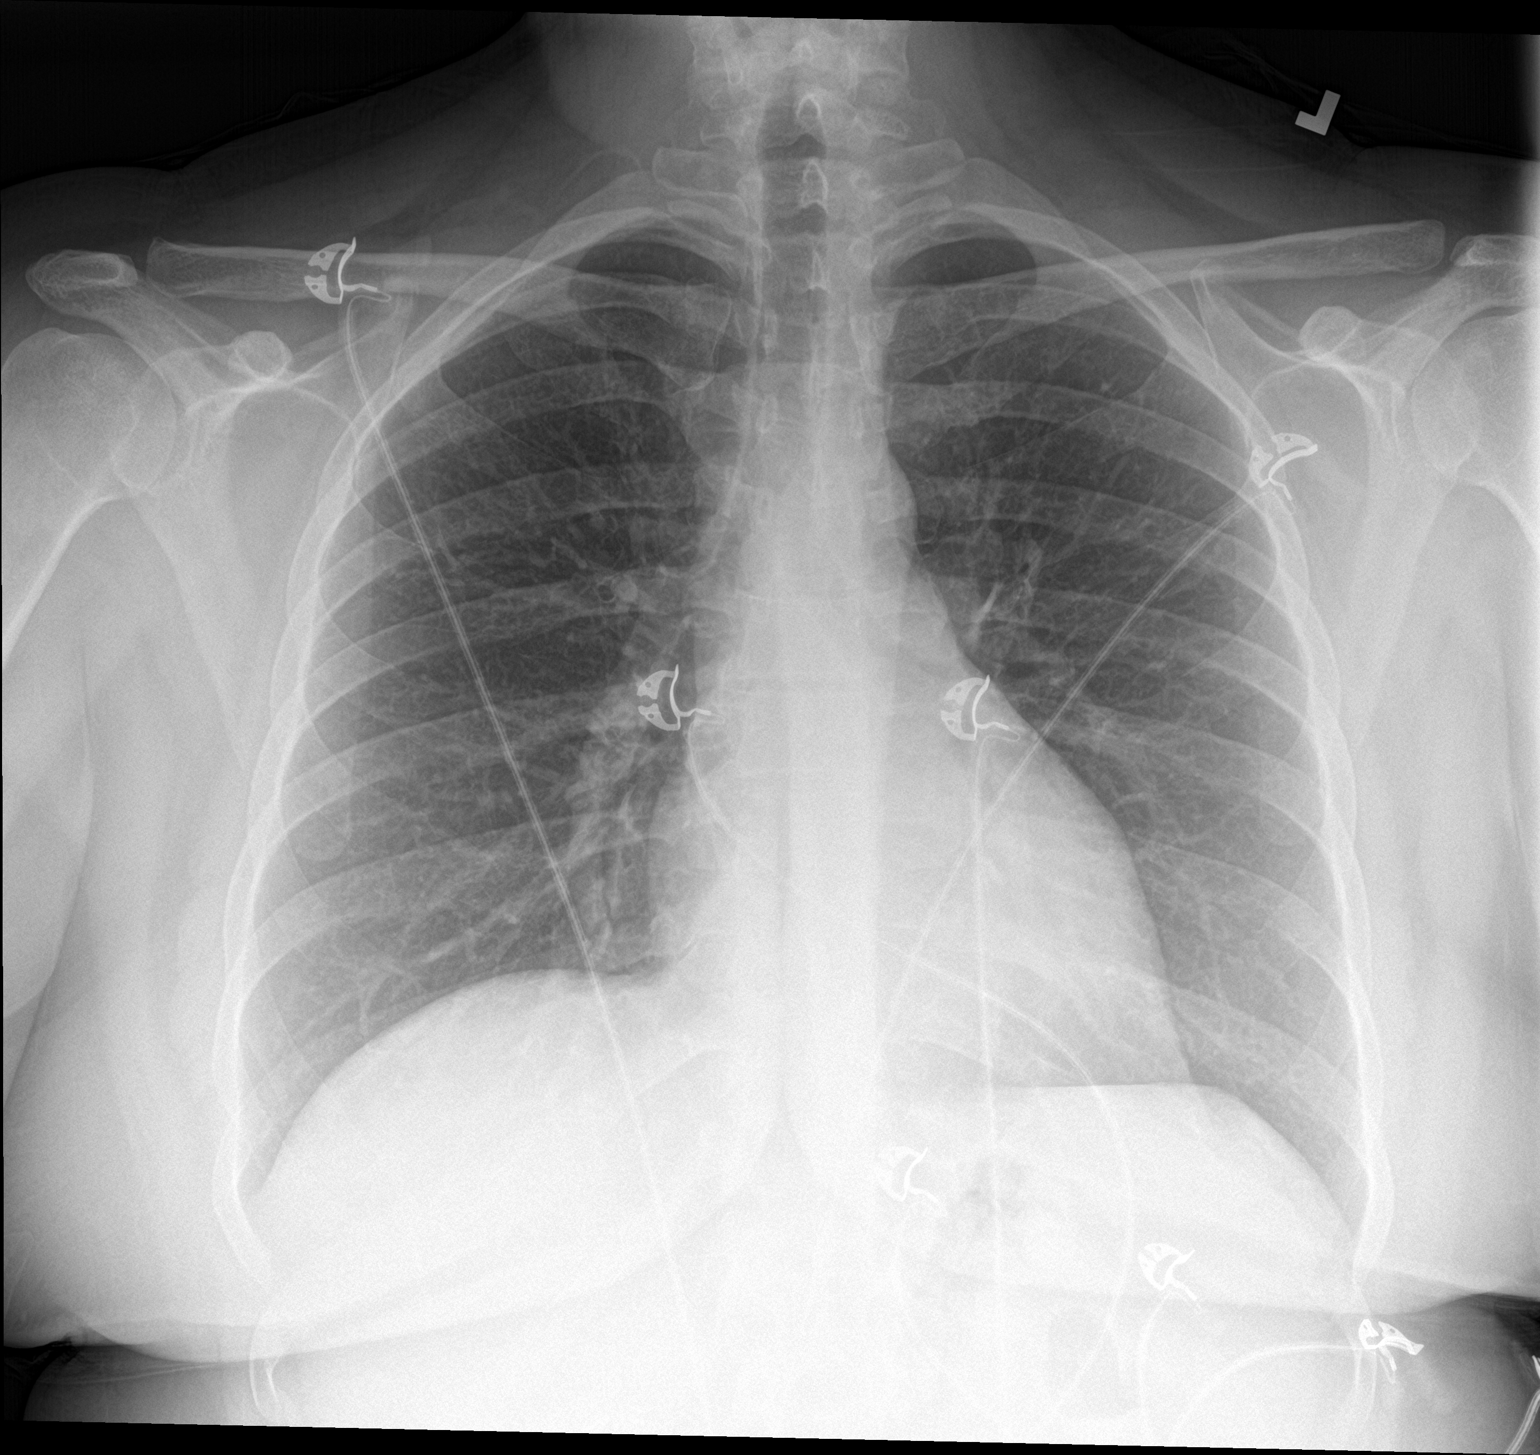

[chest lat]
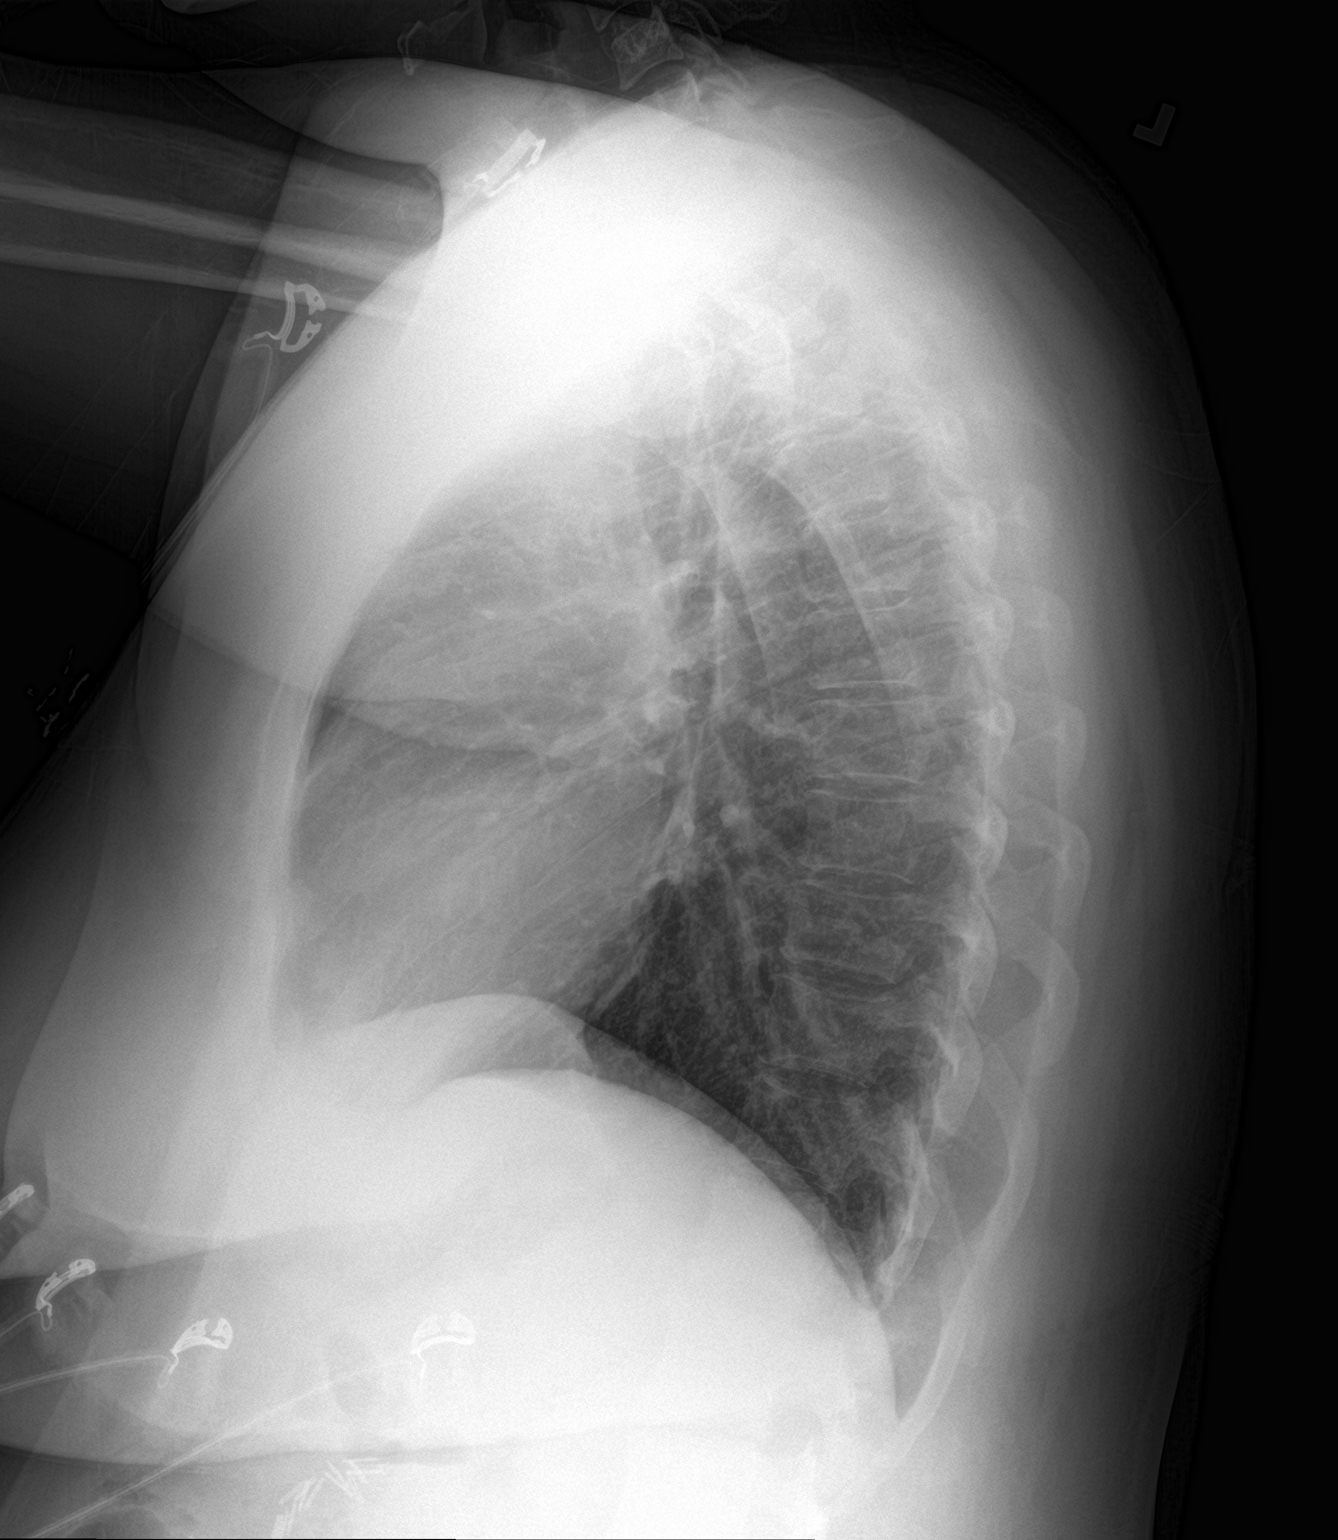

[2 of 2 positions shown; findings below may reference images not displayed]

FINDINGS: Heart size and mediastinal contours are within normal limits. No
suspicious pulmonary opacities identified.

No pleural effusion or pneumothorax visualized.

No acute osseous abnormality appreciated.
IMPRESSION: No acute intrathoracic process identified.

## 2022-05-28 ENCOUNTER — Encounter (HOSPITAL_BASED_OUTPATIENT_CLINIC_OR_DEPARTMENT_OTHER): Payer: Self-pay | Admitting: Emergency Medicine

## 2022-05-28 ENCOUNTER — Emergency Department (HOSPITAL_BASED_OUTPATIENT_CLINIC_OR_DEPARTMENT_OTHER)
Admission: EM | Admit: 2022-05-28 | Discharge: 2022-05-28 | Discharge status: Against Medical Advice | Payer: BC Managed Care – PPO | Attending: Emergency Medicine | Admitting: Emergency Medicine

## 2022-05-28 ENCOUNTER — Other Ambulatory Visit: Payer: Self-pay

## 2022-05-28 DIAGNOSIS — R1013 Epigastric pain: Secondary | ICD-10-CM | POA: Diagnosis present

## 2022-05-28 DIAGNOSIS — R1084 Generalized abdominal pain: Secondary | ICD-10-CM | POA: Diagnosis not present

## 2022-05-28 DIAGNOSIS — I10 Essential (primary) hypertension: Secondary | ICD-10-CM | POA: Insufficient documentation

## 2022-05-28 DIAGNOSIS — Z79899 Other long term (current) drug therapy: Secondary | ICD-10-CM | POA: Insufficient documentation

## 2022-05-28 DIAGNOSIS — M545 Low back pain, unspecified: Secondary | ICD-10-CM | POA: Insufficient documentation

## 2022-05-28 DIAGNOSIS — R350 Frequency of micturition: Secondary | ICD-10-CM | POA: Diagnosis not present

## 2022-05-28 DIAGNOSIS — E119 Type 2 diabetes mellitus without complications: Secondary | ICD-10-CM | POA: Insufficient documentation

## 2022-05-28 DIAGNOSIS — Z91040 Latex allergy status: Secondary | ICD-10-CM | POA: Diagnosis not present

## 2022-05-28 DIAGNOSIS — G8929 Other chronic pain: Secondary | ICD-10-CM | POA: Insufficient documentation

## 2022-05-28 LAB — URINALYSIS, ROUTINE W REFLEX MICROSCOPIC
Bilirubin Urine: NEGATIVE
Glucose, UA: NEGATIVE mg/dL
Ketones, ur: NEGATIVE mg/dL
Leukocytes,Ua: NEGATIVE
Nitrite: NEGATIVE
Protein, ur: NEGATIVE mg/dL
Specific Gravity, Urine: 1.025 (ref 1.005–1.030)
pH: 7 (ref 5.0–8.0)

## 2022-05-28 LAB — URINALYSIS, MICROSCOPIC (REFLEX)

## 2022-05-28 LAB — CBG MONITORING, ED: Glucose-Capillary: 111 mg/dL — ABNORMAL HIGH (ref 70–99)

## 2022-05-28 LAB — PREGNANCY, URINE: Preg Test, Ur: NEGATIVE

## 2022-05-28 MED ORDER — ONDANSETRON HCL 4 MG/2ML IJ SOLN: 4.0000 mg | Freq: Once | INTRAMUSCULAR | Status: DC

## 2022-05-28 MED ORDER — SODIUM CHLORIDE 0.9 % IV BOLUS: 1000.0000 mL | Freq: Once | INTRAVENOUS | Status: DC

## 2022-05-28 MED ORDER — MORPHINE SULFATE (PF) 4 MG/ML IV SOLN: 4.0000 mg | Freq: Once | INTRAVENOUS | Status: DC

## 2022-05-28 NOTE — ED Notes (Signed)
Pt requesting to go home due to child care concern. Provider to talk to patient.

## 2022-06-30 ENCOUNTER — Emergency Department (HOSPITAL_BASED_OUTPATIENT_CLINIC_OR_DEPARTMENT_OTHER)
Admission: EM | Admit: 2022-06-30 | Discharge: 2022-06-30 | Discharge status: Against Medical Advice | Payer: BC Managed Care – PPO | Attending: Emergency Medicine | Admitting: Emergency Medicine

## 2022-06-30 ENCOUNTER — Encounter (HOSPITAL_BASED_OUTPATIENT_CLINIC_OR_DEPARTMENT_OTHER): Payer: Self-pay | Admitting: Emergency Medicine

## 2022-06-30 ENCOUNTER — Other Ambulatory Visit: Payer: Self-pay

## 2022-06-30 DIAGNOSIS — R0789 Other chest pain: Secondary | ICD-10-CM | POA: Diagnosis not present

## 2022-06-30 DIAGNOSIS — F419 Anxiety disorder, unspecified: Secondary | ICD-10-CM | POA: Insufficient documentation

## 2022-06-30 DIAGNOSIS — Z5321 Procedure and treatment not carried out due to patient leaving prior to being seen by health care provider: Secondary | ICD-10-CM | POA: Diagnosis not present

## 2022-06-30 HISTORY — DX: Anxiety disorder, unspecified: F41.9

## 2022-06-30 HISTORY — DX: Anemia, unspecified: D64.9

## 2022-06-30 LAB — CBG MONITORING, ED: Glucose-Capillary: 163 mg/dL — ABNORMAL HIGH (ref 70–99)

## 2022-06-30 NOTE — ED Triage Notes (Signed)
Patient reports she has been unable to take her medicine in a few months and just started back on her insulin today, states her her blood sugar dropped to 50 pta according to her monitor. States she had some chest tightness due to anxiety pta, but has relieved at this time. Patient did not have strips and could not check the accuracy of her monitor. Patient had CBG of 163 in triage.

## 2023-03-25 ENCOUNTER — Emergency Department (HOSPITAL_BASED_OUTPATIENT_CLINIC_OR_DEPARTMENT_OTHER)
Admission: EM | Admit: 2023-03-25 | Discharge: 2023-03-25 | Discharge status: Against Medical Advice | Payer: BC Managed Care – PPO | Attending: Emergency Medicine | Admitting: Emergency Medicine

## 2023-03-25 ENCOUNTER — Encounter (HOSPITAL_BASED_OUTPATIENT_CLINIC_OR_DEPARTMENT_OTHER): Payer: Self-pay | Admitting: Urology

## 2023-03-25 DIAGNOSIS — Z5321 Procedure and treatment not carried out due to patient leaving prior to being seen by health care provider: Secondary | ICD-10-CM | POA: Diagnosis not present

## 2023-03-25 DIAGNOSIS — R7309 Other abnormal glucose: Secondary | ICD-10-CM | POA: Diagnosis present

## 2023-03-25 LAB — CBG MONITORING, ED: Glucose-Capillary: 184 mg/dL — ABNORMAL HIGH (ref 70–99)

## 2023-03-25 NOTE — ED Notes (Signed)
No answer for room x3 

## 2023-03-25 NOTE — ED Notes (Signed)
No answer 1st call

## 2023-03-25 NOTE — ED Triage Notes (Signed)
Pt states blood sugar meter may not be working  Just raised [employer] CBG says urgent low  Had OJ, cheese and pretzels   BS 184 at time of triage  Does not have a back up meter, just continuous

## 2023-07-09 ENCOUNTER — Emergency Department (HOSPITAL_BASED_OUTPATIENT_CLINIC_OR_DEPARTMENT_OTHER): Payer: BC Managed Care – PPO

## 2023-07-09 ENCOUNTER — Other Ambulatory Visit: Payer: Self-pay

## 2023-07-09 ENCOUNTER — Encounter (HOSPITAL_BASED_OUTPATIENT_CLINIC_OR_DEPARTMENT_OTHER): Payer: Self-pay | Admitting: Emergency Medicine

## 2023-07-09 ENCOUNTER — Emergency Department (HOSPITAL_BASED_OUTPATIENT_CLINIC_OR_DEPARTMENT_OTHER)
Admission: EM | Admit: 2023-07-09 | Discharge: 2023-07-09 | Disposition: A | Discharge status: Home / Self Care | Payer: BC Managed Care – PPO | Attending: Emergency Medicine | Admitting: Emergency Medicine

## 2023-07-09 DIAGNOSIS — Z91040 Latex allergy status: Secondary | ICD-10-CM | POA: Diagnosis not present

## 2023-07-09 DIAGNOSIS — Z79899 Other long term (current) drug therapy: Secondary | ICD-10-CM | POA: Insufficient documentation

## 2023-07-09 DIAGNOSIS — I10 Essential (primary) hypertension: Secondary | ICD-10-CM | POA: Diagnosis not present

## 2023-07-09 DIAGNOSIS — E119 Type 2 diabetes mellitus without complications: Secondary | ICD-10-CM | POA: Diagnosis not present

## 2023-07-09 DIAGNOSIS — M25512 Pain in left shoulder: Secondary | ICD-10-CM | POA: Insufficient documentation

## 2023-07-09 MED ORDER — ONDANSETRON 4 MG PO TBDP: 4.0000 mg | ORAL_TABLET | Freq: Three times a day (TID) | ORAL | 0 refills | Status: AC | PRN

## 2023-07-09 MED ORDER — ONDANSETRON 4 MG PO TBDP
4.0000 mg | ORAL_TABLET | Freq: Once | ORAL | Status: AC
  Administered 2023-07-09: 4 mg via ORAL
  Filled 2023-07-09: qty 1

## 2023-07-09 MED ORDER — OXYCODONE-ACETAMINOPHEN 5-325 MG PO TABS
1.0000 | ORAL_TABLET | Freq: Once | ORAL | Status: AC
  Administered 2023-07-09: 1 via ORAL
  Filled 2023-07-09: qty 1

## 2023-07-09 MED ORDER — OXYCODONE-ACETAMINOPHEN 5-325 MG PO TABS
1.0000 | ORAL_TABLET | Freq: Four times a day (QID) | ORAL | 0 refills | Status: AC | PRN
Start: 2023-07-09 — End: ?

## 2023-07-09 NOTE — Discharge Instructions (Addendum)
Please follow-up with orthopedics as instructed.  Use your shoulder immobilizer at all times, to help secure the area, and help with comfort.  I prescribed a short dose of pain medication, to help with the pain, and you can also take ibuprofen along with this.  Return to the ER if you feel like your symptoms are worsening, you have numbness or tingling in your arms, or chest pain.  Do not do any heavy lifting with this arm, as it will cause more strain to the muscle, possibly worsening tear.

## 2023-07-09 NOTE — ED Provider Notes (Signed)
[place] [facility] Provider Note   CSN: [id number] Arrival date & time: 07/09/23  4098     History  Chief Complaint  Patient presents with   Shoulder Pain    Lori Saunders is a 37 y.o. female, history of diabetes, hypertension, who presents to the ED secondary to left posterior shoulder pain, that is been going on for the last couple hours.  She states in the middle of the night, she attempted to take a pillow from her husband, and reached over and had sudden and sharp pain.  She states when she does not move, the pain does not hurt, but when she tries to lift up her arm, or if she tries to get up she has sharp shooting pain in the arm.  She also reports she feels some weakness in the left arm.  Deep breath, or moving makes the pain worse.  Denies any kind of recent surgeries, shortness of breath, chest pain, or vomiting.  She states the pain gets so bad sometimes that she has nausea.  Notes that she has never had this pain before, and it occurred immediately after reaching over to get a pillow.     Home Medications Prior to Admission medications   Medication Sig Start Date End Date Taking? Authorizing Provider  ondansetron (ZOFRAN-ODT) 4 MG disintegrating tablet Take 1 tablet (4 mg total) by mouth every 8 (eight) hours as needed. 07/09/23  Yes Parris Cudworth L, PA  oxyCODONE-acetaminophen (PERCOCET/ROXICET) 5-325 MG tablet Take 1 tablet by mouth every 6 (six) hours as needed for severe pain. 07/09/23  Yes Annia Gomm L, PA  atorvastatin (LIPITOR) 10 MG tablet Take 10 mg by mouth daily.    [provider]  Canagliflozin (INVOKANA PO) Take by mouth.    [provider]  cyclobenzaprine (FLEXERIL) 10 MG tablet Take 1 tablet (10 mg total) by mouth 2 (two) times daily as needed for muscle spasms. 02/27/21   Gwyneth Sprout, MD  Dapagliflozin Propanediol (FARXIGA PO) Take by mouth.    [provider]  lisinopril (ZESTRIL) 10 MG  tablet Take 10 mg by mouth daily.    [provider]  ondansetron (ZOFRAN) 4 MG tablet Take 1 tablet (4 mg total) by mouth every 8 (eight) hours as needed for nausea or vomiting. 11/18/21   Terrilee Files, MD      Allergies    Latex    Review of Systems   Review of Systems  Respiratory:  Negative for chest tightness and shortness of breath.     Physical Exam Updated Vital Signs BP (!) 126/96   Pulse 88   Temp 98 F (36.7 C) (Oral)   Resp 16   Ht 5\' 3"  (1.6 m)   Wt 105.2 kg   LMP 07/06/2023   SpO2 100%   BMI 41.10 kg/m  Physical Exam Vitals and nursing note reviewed.  Constitutional:      General: She is not in acute distress.    Appearance: She is well-developed.  HENT:     Head: Normocephalic and atraumatic.  Eyes:     Conjunctiva/sclera: Conjunctivae normal.  Cardiovascular:     Rate and Rhythm: Normal rate and regular rhythm.     Heart sounds: No murmur heard. Pulmonary:     Effort: Pulmonary effort is normal. No respiratory distress.     Breath sounds: Normal breath sounds.  Abdominal:     Palpations: Abdomen is soft.     Tenderness: There is  no abdominal tenderness.  Musculoskeletal:        General: No swelling.     Cervical back: Neck supple.     Comments: Tenderness to palpation infraspinatus, along medial left shoulder.  Pain worse with abduction, and external rotation.  Can only abduct to 90 degrees.  Increased weakness of left arm/shoulder.  No tenderness to midline spine, chest or ribcage.  Skin:    General: Skin is warm and dry.     Capillary Refill: Capillary refill takes less than 2 seconds.  Neurological:     Mental Status: She is alert.  Psychiatric:        Mood and Affect: Mood normal.     ED Results / Procedures / Treatments   Labs (all labs ordered are listed, but only abnormal results are displayed) Labs Reviewed - No data to display  EKG EKG Interpretation Date/Time:  Tuesday July 09 2023 10:17:56 EDT Ventricular  Rate:  76 PR Interval:  140 QRS Duration:  91 QT Interval:  557 QTC Calculation: 627 R Axis:   104  Text Interpretation: Sinus rhythm Borderline right axis deviation Nonspecific T abnormalities, lateral leads No significant change since prior 6/23 Confirmed by Meridee Score [phone]) on 07/09/2023 10:22:57 AM  Radiology DG Shoulder Left  Result Date: 07/09/2023 CLINICAL DATA:  Posterior left shoulder pain since this morning when pulled a pillow from behind her husband. EXAM: LEFT SHOULDER - 2+ VIEW COMPARISON:  None Available. FINDINGS: Normal bone mineralization. Joint spaces are preserved. No acute fracture or dislocation. The visualized portion of the left lung is unremarkable. IMPRESSION: No acute fracture or dislocation. Electronically Signed   By: Neita Garnet M.D.   On: 07/09/2023 10:15    Procedures Procedures    Medications Ordered in ED Medications  oxyCODONE-acetaminophen (PERCOCET/ROXICET) 5-325 MG per tablet 1 tablet (1 tablet Oral Given 07/09/23 1008)  ondansetron (ZOFRAN-ODT) disintegrating tablet 4 mg (4 mg Oral Given 07/09/23 1008)    ED Course/ Medical Decision Making/ A&P                                 Medical Decision Making Patient is a 37 year old female,, here for left arm pain, after reaching over to get a pillow from her husband earlier tonight.  She states since then it has been very painful, to move her arm, and to lift things.  She feels weak.  She does have notable reduced abduction, pain with external rotation.  She has tenderness to palpation of the medial aspect of her left shoulder.  No chest pain, or shortness of breath, however given age, comorbidities we will obtain EKG for further evaluation care.  I am suspicious of a rotator cuff tear.  Will obtain x-ray to evaluate for any bony damage  Amount and/or Complexity of Data Reviewed Radiology: ordered.    Details: X-ray unremarkable ECG/medicine tests:  Decision-making details documented in ED  Course. Discussion of management or test interpretation with external provider(s): Discussed with patient, I believe that her pain is likely secondary to rotator cuff tear, she is given a shoulder immobilizer, and a few days of Percocet for pain control, as she states pain is very severe, and she has 2 special needs children at home, and is having hard time doing her activities of daily living.  We discussed the importance of not lifting with this arm, and asking for help.  As well as taking the pain medication, and the  ibuprofen as needed.  I prescribed her some Zofran to help with the nausea, that she gets with the pain medicine.  We discussed return precautions and she voiced understanding.Her EKG was reassuring  Risk Prescription drug management.    Final Clinical Impression(s) / ED Diagnoses Final diagnoses:  Acute pain of left shoulder    Rx / DC Orders ED Discharge Orders          Ordered    oxyCODONE-acetaminophen (PERCOCET/ROXICET) 5-325 MG tablet  Every 6 hours PRN        07/09/23 1042    ondansetron (ZOFRAN-ODT) 4 MG disintegrating tablet  Every 8 hours PRN        07/09/23 1043              Iosefa Weintraub, [place], PA 07/09/23 1047    Terrilee Files, MD 07/09/23 1731

## 2023-07-09 NOTE — ED Triage Notes (Signed)
Left shoulder pain since 4 am when she pulled a pillow from behind her husband has good pulse and neuro and can wiggle fingers  muscle behind shoulder hurts

## 2024-01-23 ENCOUNTER — Emergency Department (HOSPITAL_BASED_OUTPATIENT_CLINIC_OR_DEPARTMENT_OTHER)
Admission: EM | Admit: 2024-01-23 | Discharge: 2024-01-23 | Disposition: A | Discharge status: Home / Self Care | Payer: BC Managed Care – PPO | Attending: Emergency Medicine | Admitting: Emergency Medicine

## 2024-01-23 ENCOUNTER — Emergency Department (HOSPITAL_BASED_OUTPATIENT_CLINIC_OR_DEPARTMENT_OTHER): Payer: BC Managed Care – PPO

## 2024-01-23 ENCOUNTER — Other Ambulatory Visit: Payer: Self-pay

## 2024-01-23 DIAGNOSIS — W274XXA Contact with kitchen utensil, initial encounter: Secondary | ICD-10-CM | POA: Diagnosis not present

## 2024-01-23 DIAGNOSIS — Z91040 Latex allergy status: Secondary | ICD-10-CM | POA: Insufficient documentation

## 2024-01-23 DIAGNOSIS — S6991XA Unspecified injury of right wrist, hand and finger(s), initial encounter: Secondary | ICD-10-CM | POA: Diagnosis present

## 2024-01-23 DIAGNOSIS — Z23 Encounter for immunization: Secondary | ICD-10-CM | POA: Diagnosis not present

## 2024-01-23 DIAGNOSIS — S61304A Unspecified open wound of right ring finger with damage to nail, initial encounter: Secondary | ICD-10-CM | POA: Diagnosis not present

## 2024-01-23 DIAGNOSIS — Y93G1 Activity, food preparation and clean up: Secondary | ICD-10-CM | POA: Diagnosis not present

## 2024-01-23 DIAGNOSIS — S61209A Unspecified open wound of unspecified finger without damage to nail, initial encounter: Secondary | ICD-10-CM

## 2024-01-23 MED ORDER — ONDANSETRON 4 MG PO TBDP
4.0000 mg | ORAL_TABLET | Freq: Once | ORAL | Status: AC
  Administered 2024-01-23: 4 mg via ORAL
  Filled 2024-01-23: qty 1

## 2024-01-23 MED ORDER — TETANUS-DIPHTH-ACELL PERTUSSIS 5-2.5-18.5 LF-MCG/0.5 IM SUSY
0.5000 mL | PREFILLED_SYRINGE | Freq: Once | INTRAMUSCULAR | Status: AC
  Administered 2024-01-23: 0.5 mL via INTRAMUSCULAR
  Filled 2024-01-23: qty 0.5

## 2024-01-23 NOTE — ED Triage Notes (Signed)
Pt states she was cutting potatoes and sliced off 1/2 inch of right ring finger.   Pt has piece of finger with her. Bleeding is controlled.

## 2024-01-23 NOTE — ED Provider Notes (Signed)
 [place] [facility] Provider Note   CSN: [id number] Arrival date & time: 01/23/24  1216     History Chief Complaint  Patient presents with   Finger Injury    Latashia Koch is a 38 y.o. female patient who presents to the emergency department today for further evaluation of a finger injury that occurred just prior to arrival.  Patient states that she was cutting some tomatoes with a mandolin when she accidentally caught her finger.  There is a partial tip avulsion localized to the right fourth finger.  Bleeding was controlled with direct pressure.  Tdap is not up-to-date.  HPI     Home Medications Prior to Admission medications   Medication Sig Start Date End Date Taking? Authorizing Provider  atorvastatin (LIPITOR) 10 MG tablet Take 10 mg by mouth daily.    [provider]  Canagliflozin (INVOKANA PO) Take by mouth.    [provider]  cyclobenzaprine (FLEXERIL) 10 MG tablet Take 1 tablet (10 mg total) by mouth 2 (two) times daily as needed for muscle spasms. 02/27/21   Gwyneth Sprout, MD  Dapagliflozin Propanediol (FARXIGA PO) Take by mouth.    [provider]  lisinopril (ZESTRIL) 10 MG tablet Take 10 mg by mouth daily.    [provider]  ondansetron (ZOFRAN) 4 MG tablet Take 1 tablet (4 mg total) by mouth every 8 (eight) hours as needed for nausea or vomiting. 11/18/21   Terrilee Files, MD  ondansetron (ZOFRAN-ODT) 4 MG disintegrating tablet Take 1 tablet (4 mg total) by mouth every 8 (eight) hours as needed. 07/09/23   Small, Brooke L, PA  oxyCODONE-acetaminophen (PERCOCET/ROXICET) 5-325 MG tablet Take 1 tablet by mouth every 6 (six) hours as needed for severe pain. 07/09/23   Small, Brooke L, PA      Allergies    Latex    Review of Systems   Review of Systems  All other systems reviewed and are negative.   Physical Exam Updated Vital Signs BP 127/85   Pulse 95   Temp 98.3 F (36.8 C)  (Oral)   Resp 18   SpO2 96%  Physical Exam Vitals and nursing note reviewed.  Constitutional:      Appearance: Normal appearance.  HENT:     Head: Normocephalic and atraumatic.  Eyes:     General:        Right eye: No discharge.        Left eye: No discharge.     Conjunctiva/sclera: Conjunctivae normal.  Pulmonary:     Effort: Pulmonary effort is normal.  Skin:    General: Skin is warm and dry.     Findings: No rash.     Comments: There is a partial tip avulsion to the right fourth finger.  There is no nail involvement.  Neurological:     General: No focal deficit present.     Mental Status: She is alert.  Psychiatric:        Mood and Affect: Mood normal.        Behavior: Behavior normal.     ED Results / Procedures / Treatments   Labs (all labs ordered are listed, but only abnormal results are displayed) Labs Reviewed - No data to display  EKG None  Radiology DG Finger Ring Right Result Date: 01/23/2024 CLINICAL DATA:  Right finger laceration. EXAM: RIGHT RING FINGER 2+V COMPARISON:  None Available. FINDINGS: There is no evidence of fracture or dislocation. There is no evidence  of arthropathy or other focal bone abnormality. Irregularity distal soft tissues is noted consistent with history of laceration. No radiopaque foreign body. IMPRESSION: No fracture or dislocation. Electronically Signed   By: Lupita Raider M.D.   On: 01/23/2024 13:05    Procedures Procedures    Medications Ordered in ED Medications  ondansetron (ZOFRAN-ODT) disintegrating tablet 4 mg (has no administration in time range)  Tdap (BOOSTRIX) injection 0.5 mL (has no administration in time range)    ED Course/ Medical Decision Making/ A&P   {   Click here for ABCD2, HEART and other calculators  Medical Decision Making Minnie Legros is a 38 y.o. female patient who presents to the emergency department today for further evaluation of a finger laceration.  This is a tip avulsion injury.   There is no suturing to be done based on this injury.  I will update her Tdap.  Will also place the finger in a bulky dressing and have her follow-up with her primary care provider.  Strict return precautions were discussed.  She is safe for discharge at this time.   Amount and/or Complexity of Data Reviewed Radiology: ordered.  Risk Prescription drug management.   Final Clinical Impression(s) / ED Diagnoses Final diagnoses:  Avulsion of finger tip, initial encounter    Rx / DC Orders ED Discharge Orders     None         Teressa Lower, [place] 01/23/24 1331    Vanetta Mulders, MD 01/25/24 [phone]

## 2024-01-23 NOTE — Discharge Instructions (Addendum)
This will heal on its own. Please keep area clean and dry.  You can take 600 mg of ibuprofen every 6 hours as needed for pain.  I would not submerge in water for prolonged periods of time until the wound is completely healed.  May follow-up with your primary care doctor for further evaluation.  You may return to the Emergency Department for any worsening symptoms.

## 2024-10-27 ENCOUNTER — Emergency Department (HOSPITAL_BASED_OUTPATIENT_CLINIC_OR_DEPARTMENT_OTHER)
Admission: EM | Admit: 2024-10-27 | Discharge: 2024-10-27 | Disposition: A | Discharge status: Home / Self Care | Attending: Emergency Medicine | Admitting: Emergency Medicine

## 2024-10-27 ENCOUNTER — Emergency Department (HOSPITAL_BASED_OUTPATIENT_CLINIC_OR_DEPARTMENT_OTHER)

## 2024-10-27 ENCOUNTER — Other Ambulatory Visit: Payer: Self-pay

## 2024-10-27 DIAGNOSIS — Z91040 Latex allergy status: Secondary | ICD-10-CM | POA: Insufficient documentation

## 2024-10-27 DIAGNOSIS — S90812A Abrasion, left foot, initial encounter: Secondary | ICD-10-CM

## 2024-10-27 DIAGNOSIS — E119 Type 2 diabetes mellitus without complications: Secondary | ICD-10-CM | POA: Diagnosis not present

## 2024-10-27 DIAGNOSIS — W208XXA Other cause of strike by thrown, projected or falling object, initial encounter: Secondary | ICD-10-CM | POA: Diagnosis not present

## 2024-10-27 DIAGNOSIS — S9032XA Contusion of left foot, initial encounter: Secondary | ICD-10-CM | POA: Diagnosis present

## 2024-10-27 MED ORDER — ACETAMINOPHEN 500 MG PO TABS
1000.0000 mg | ORAL_TABLET | Freq: Once | ORAL | Status: AC
  Administered 2024-10-27: 1000 mg via ORAL
  Filled 2024-10-27: qty 2

## 2024-10-27 MED ORDER — IBUPROFEN 400 MG PO TABS
400.0000 mg | ORAL_TABLET | Freq: Once | ORAL | Status: AC
  Administered 2024-10-27: 400 mg via ORAL
  Filled 2024-10-27: qty 1

## 2024-10-27 NOTE — ED Provider Notes (Signed)
  EM[facility] AT [facility] [place]   CSN: [id number] Arrival date & time: 10/27/24  9157     Patient presents with: Foot Pain   Lori Saunders is a 38 y.o. female.   Patient c/o contusion to dorsum of left foot two days ago - couch accidentally got dropped onto foot. V superficial abrasion to foot (no bleeding, just superficial layer of skin), indicates tetanus is up to date. No spreading redness. Has been ambulatory since, but notes pain/contusion to mid food in region 1st metatarsal. No numbness. No other pain or injury.   The history is provided by the patient.  Foot Pain       Prior to Admission medications   Medication Sig Start Date End Date Taking? Authorizing Provider  atorvastatin (LIPITOR) 10 MG tablet Take 10 mg by mouth daily.    [provider]  Canagliflozin (INVOKANA PO) Take by mouth.    [provider]  cyclobenzaprine  (FLEXERIL ) 10 MG tablet Take 1 tablet (10 mg total) by mouth 2 (two) times daily as needed for muscle spasms. 02/27/21   Doretha Folks, MD  Dapagliflozin Propanediol (FARXIGA PO) Take by mouth.    [provider]  lisinopril (ZESTRIL) 10 MG tablet Take 10 mg by mouth daily.    [provider]  ondansetron  (ZOFRAN ) 4 MG tablet Take 1 tablet (4 mg total) by mouth every 8 (eight) hours as needed for nausea or vomiting. 11/18/21   Towana Ozell BROCKS, MD  ondansetron  (ZOFRAN -ODT) 4 MG disintegrating tablet Take 1 tablet (4 mg total) by mouth every 8 (eight) hours as needed. 07/09/23   Small, Brooke L, PA  oxyCODONE -acetaminophen  (PERCOCET/ROXICET) 5-325 MG tablet Take 1 tablet by mouth every 6 (six) hours as needed for severe pain. 07/09/23   Small, Brooke L, PA    Allergies: Latex    Review of Systems  Constitutional:  Negative for fever.  Musculoskeletal:        Contusion foot  Skin:  Negative for wound.  Neurological:  Negative for numbness.    Updated Vital Signs BP  112/75   Pulse 80   Temp 97.8 F (36.6 C)   Resp 16   Wt 100.7 kg   LMP 10/13/2024 (Approximate)   SpO2 97%   BMI 39.33 kg/m   Physical Exam Vitals and nursing note reviewed.  Constitutional:      Appearance: Normal appearance. She is well-developed.  HENT:     Head: Atraumatic.  Neck:     Trachea: No tracheal deviation.  Cardiovascular:     Pulses: Normal pulses.  Pulmonary:     Effort: Pulmonary effort is normal. No respiratory distress.  Musculoskeletal:     Cervical back: No muscular tenderness.     Comments: Contusion, tenderness mid to distal left 1st tarsal region dorsally. No significant sts  noted. No sign of infection. Dp/pt palp. Normal cap refill in toes. Able to move toes/great toe.   Skin:    General: Skin is warm and dry.     Findings: No rash.  Neurological:     Mental Status: She is alert.     Comments: Alert, speech normal. Right foot is nvi.   Psychiatric:        Mood and Affect: Mood normal.     (all labs ordered are listed, but only abnormal results are displayed) Labs Reviewed - No data to display  EKG: None  Radiology: DG Foot Complete Left Result Date: 10/27/2024 EXAM: 3 or more  VIEW(S) XRAY OF THE LEFT FOOT 10/27/2024 09:12:54 AM COMPARISON: None available. CLINICAL HISTORY: [id number] Injury [id number] FINDINGS: BONES AND JOINTS: No acute fracture. Mild spurring from the plantar surface of the calcaneus. No joint dislocation. SOFT TISSUES: The soft tissues are unremarkable. IMPRESSION: 1. No acute findings. 2. Mild spurring from the plantar surface of the calcaneus. Electronically signed by: Evalene Coho MD 10/27/2024 09:23 AM EST RP Workstation: [id number]     Procedures   Medications Ordered in the ED  ibuprofen  (ADVIL ) tablet 400 mg (400 mg Oral Given 10/27/24 0921)  acetaminophen  (TYLENOL ) tablet 1,000 mg (1,000 mg Oral Given 10/27/24 9077)                                    Medical Decision Making Problems Addressed: Abrasion,  left foot, initial encounter: acute illness or injury Contusion of left foot, initial encounter: acute illness or injury  Amount and/or Complexity of Data Reviewed Radiology: ordered and independent interpretation performed. Decision-making details documented in ED Course.  Risk OTC drugs. Prescription drug management.   Imaging ordered.   No meds today. Acetaminophen  po, ibuprofen  po.   Reviewed nursing notes and prior charts for additional history.   Xrays reviewed/interpreted by me - no fx.        Final diagnoses:  None    ED Discharge Orders     None          Bernard Drivers, MD 10/27/24 [phone]

## 2024-10-27 NOTE — Discharge Instructions (Addendum)
 It was our pleasure to provide your ER care today - we hope that you feel better.  Your xray looks good - no fracture is seen.   Keep area very clean. Take acetaminophen  or ibuprofen  as need.   Return to ER if worse, new symptoms, infection of area, severe pain, or other concern.

## 2024-10-27 NOTE — ED Triage Notes (Addendum)
 Ambulatory to triage with noticeable limp Pt reports dropping couch on the top of left foot 2 days. Increase pain with movement of great toe Pt states she is DM and also has plantar fascitis Slight bruising and abraised area
